# Patient Record
Sex: Female | Born: 1970 | Race: Asian | Hispanic: No | Marital: Married | Smoking: Never smoker
Health system: Southern US, Community
[De-identification: ages and names within clinical notes are randomized; demographics above are authoritative.]

---

## 2004-06-22 ENCOUNTER — Other Ambulatory Visit: Admission: RE | Admit: 2004-06-22 | Discharge: 2004-06-22 | Payer: Self-pay | Admitting: Obstetrics and Gynecology

## 2004-08-20 ENCOUNTER — Ambulatory Visit (HOSPITAL_COMMUNITY): Admission: RE | Admit: 2004-08-20 | Discharge: 2004-08-20 | Payer: Self-pay | Admitting: Obstetrics and Gynecology

## 2004-10-21 ENCOUNTER — Ambulatory Visit: Admission: RE | Admit: 2004-10-21 | Discharge: 2004-10-21 | Payer: Self-pay | Admitting: Obstetrics and Gynecology

## 2004-11-25 ENCOUNTER — Ambulatory Visit (HOSPITAL_COMMUNITY): Admission: RE | Admit: 2004-11-25 | Discharge: 2004-11-25 | Payer: Self-pay | Admitting: Obstetrics and Gynecology

## 2004-12-14 ENCOUNTER — Inpatient Hospital Stay (HOSPITAL_COMMUNITY): Admission: AD | Admit: 2004-12-14 | Discharge: 2004-12-14 | Payer: Self-pay | Admitting: Internal Medicine

## 2004-12-16 ENCOUNTER — Inpatient Hospital Stay (HOSPITAL_COMMUNITY): Admission: AD | Admit: 2004-12-16 | Discharge: 2004-12-18 | Payer: Self-pay | Admitting: Obstetrics and Gynecology

## 2005-05-21 ENCOUNTER — Emergency Department (HOSPITAL_COMMUNITY): Admission: EM | Admit: 2005-05-21 | Discharge: 2005-05-21 | Payer: Self-pay | Admitting: Family Medicine

## 2005-06-23 ENCOUNTER — Other Ambulatory Visit: Admission: RE | Admit: 2005-06-23 | Discharge: 2005-06-23 | Payer: Self-pay | Admitting: Obstetrics and Gynecology

## 2005-08-26 ENCOUNTER — Ambulatory Visit (HOSPITAL_COMMUNITY): Admission: RE | Admit: 2005-08-26 | Discharge: 2005-08-26 | Payer: Self-pay | Admitting: Family Medicine

## 2007-04-08 ENCOUNTER — Emergency Department (HOSPITAL_COMMUNITY): Admission: EM | Admit: 2007-04-08 | Discharge: 2007-04-08 | Payer: BLUE CROSS/BLUE SHIELD | Admitting: Family Medicine

## 2007-10-31 ENCOUNTER — Encounter (INDEPENDENT_AMBULATORY_CARE_PROVIDER_SITE_OTHER): Payer: Self-pay | Admitting: Obstetrics and Gynecology

## 2007-10-31 ENCOUNTER — Ambulatory Visit: Admission: RE | Admit: 2007-10-31 | Discharge: 2007-10-31 | Payer: Self-pay | Admitting: Obstetrics and Gynecology

## 2007-10-31 ENCOUNTER — Ambulatory Visit: Payer: Self-pay | Admitting: Vascular Surgery

## 2007-11-01 ENCOUNTER — Inpatient Hospital Stay (HOSPITAL_COMMUNITY): Admission: AD | Admit: 2007-11-01 | Discharge: 2007-11-03 | Payer: Self-pay | Admitting: Obstetrics and Gynecology

## 2008-02-23 ENCOUNTER — Ambulatory Visit: Payer: Self-pay | Admitting: Vascular Surgery

## 2008-02-23 ENCOUNTER — Inpatient Hospital Stay (HOSPITAL_COMMUNITY): Admission: EM | Admit: 2008-02-23 | Discharge: 2008-02-28 | Payer: Self-pay | Admitting: Emergency Medicine

## 2008-02-23 ENCOUNTER — Encounter (INDEPENDENT_AMBULATORY_CARE_PROVIDER_SITE_OTHER): Payer: Self-pay | Admitting: Internal Medicine

## 2008-02-25 ENCOUNTER — Encounter (INDEPENDENT_AMBULATORY_CARE_PROVIDER_SITE_OTHER): Payer: Self-pay | Admitting: Emergency Medicine

## 2008-02-25 ENCOUNTER — Ambulatory Visit: Payer: Self-pay | Admitting: Internal Medicine

## 2010-02-12 ENCOUNTER — Ambulatory Visit: Payer: Self-pay | Admitting: Vascular Surgery

## 2010-02-12 ENCOUNTER — Ambulatory Visit: Admission: RE | Admit: 2010-02-12 | Discharge: 2010-02-12 | Payer: Self-pay | Admitting: Internal Medicine

## 2010-02-12 ENCOUNTER — Encounter (INDEPENDENT_AMBULATORY_CARE_PROVIDER_SITE_OTHER): Payer: Self-pay | Admitting: Internal Medicine

## 2010-11-23 NOTE — Discharge Summary (Signed)
Heather Poole, Heather Poole                       ACCOUNT NO.:  [id number]   MEDICAL RECORD NO.:  [id number]          PATIENT TYPE:  INP   LOCATION:  9112                          FACILITY:  [facility]   PHYSICIAN:  Zenaida Niece, M.D.DATE OF BIRTH:  1971-03-18   DATE OF ADMISSION:  11/01/2007  DATE OF DISCHARGE:  11/03/2007                               DISCHARGE SUMMARY   ADMISSION DIAGNOSES:  1. Intrauterine pregnancy at 38 weeks.  2. Advanced maternal age.   DISCHARGE DIAGNOSES:  1. Intrauterine pregnancy at 38 weeks.  2. Advanced maternal age.   PROCEDURE:  On November 01, 2007 she had a spontaneous vaginal delivery.   HISTORY AND PHYSICAL:  This is a 40 year old Asian female gravida 2,  para 1-0-0-1 with an EGA of [redacted] weeks who presents with the complaint of  regular contractions.  Exam in the office revealed cervix to be 2 cm  dilated.  Prenatal care was late at 40 weeks and was uneventful except  for advanced maternal age for which she was too late for genetic  screening.  Prenatal labs, blood type is B+ with negative antibody  screen, rubella immune, RPR nonreactive, HIV negative, hepatitis B  surface antigen negative, gonorrhea and chlamydia negative, group B  strep negative, 1-hour Glucola 115.   PAST OBG HISTORY:  One vaginal delivery at term 6 pounds 8 ounces.   GYN HISTORY:  Low grade SIL Pap smear with normal colposcopy.   PHYSICAL EXAMINATION:  She is afebrile with stable vital signs.  ABDOMEN:  Gravid, nontender.  CERVIX:  Dr. Berenda Morale first exam in labor and delivery, she has 5 to  6, complete -1 and few rupture membranes revealed moderate meconium.   HOSPITAL COURSE:  The patient was admitted and continued progress on her  own.  Dr. Senaida Ores examined her rupture membranes and discovered  meconium.  She was placed in internal monitors.  She progressed to 9 cm  and began involuntarily pushing.  Cervix was reduced and after about 15  minutes, she had a vaginal delivery of a  viable female infant with  Apgars of 5 and 9, weight 7 pounds 11 ounces.  Tight nuchal cord was  delivered through.  DeLee suction was done on the perineum.  Placenta  delivered spontaneous, and trailing membranes removed with ring forceps.  She had a second-degree laceration repaired with 2-0 Vicryl and  estimated blood loss was 400 mL.  Postpartum, she had no significant  complications.  Pre delivery hemoglobin 11.6, postdelivery 11.5.  On  postpartum #2 she was felt to be stable enough for discharge home.   DISCHARGE INSTRUCTIONS:  Regular diet, pelvic rest, follow up in 6  weeks.   MEDICATIONS:  1. Ibuprofen 600 mg #30 1 p.o. q.6 h p.r.n.  2. Percocet #20, 1 to 2 p.o. q. 4-6 hours p.r.n. pain and she is given      our discharge pamphlet.      Zenaida Niece, M.D.  Electronically Signed     TDM/MEDQ  D:  11/03/2007  T:  11/03/2007  Job:  [id number] 

## 2010-11-23 NOTE — Discharge Summary (Signed)
[name], Poole                   ACCOUNT NO.:  [id number]   MEDICAL RECORD NO.:  [id number]          PATIENT TYPE:  INP   LOCATION:  3742                         FACILITY:  [facility]   PHYSICIAN:  Altha Harm, MDDATE OF BIRTH:  Mar 03, 1971   DATE OF ADMISSION:  02/22/2008  DATE OF DISCHARGE:  02/28/2008                               DISCHARGE SUMMARY   DISCHARGE DISPOSITION:  Home.   FINAL DISCHARGE DIAGNOSES:  1. Latent tuberculosis.  2. Hypothyroidism.  3. Diarrhea, noninfectious.  4. Leukocytosis.  5. Systemic inflammatory response syndrome, resolved.  6. Hypertension, resolved.  7. Hypokalemia, resolved.  8. Hyponatremia, resolved.  9. Lower extremity swelling and pain.   DISCHARGE MEDICATIONS:  Synthroid 50 mcg p.o. daily.   CONSULTANTS:  1. Dr. Cliffton Asters, Infectious Diseases.  2. Phone consultation with Dr. Jimmy Footman.   CODE STATUS:  Full code.   ALLERGIES:  No known drug allergies.   CHIEF COMPLAINT:  Worsening lower extremity pain and swelling.   HISTORY OF PRESENT ILLNESS:  Refer to the H&P dictated by Dr. Arthor Captain for  details of the HPI.   HOSPITAL COURSE:  1. Systemic inflammatory response syndrome.  The patient was admitted      with a constellation of symptoms including hypotension,      leukocytosis, fever, swelling, and generalized malaise.  The      patient has had cultures sent, including blood and urine cultures      which were all negative.  The patient had been started on broad-      spectrum antibiotics due to her clinical presentation.  The      antibiotics were discontinued with the input from Infectious      Diseases and the patient was evaluated off antibiotics.  The      patient was tested for tuberculosis and found to test positive.      Follow up with the health department confirmed that the patient had      been tested positive for TB in the past.  However, had not received      any direct observed treatment.  The health  department was      contacted, and one of the representatives,[name] came in      and met the patient and gave the patient followup information to      follow in the health department for direct observed treatment.  I      spoke with Ms. Church myself.  Her phone number [phone], and she      has assured me that the patient has been instructed to contact them      after discharge from the hospital.  I informed her that the patient      is being discharged today and she also has follow up and contact      information of the patient and they will also make contact with the      patient to initiate the directed observed treatment for INH.  2. Axillary adenopathy.  The patient was found to have axillary  adenopathy and had been seen by Dr. Jimmy Footman of Rheumatology for      evaluation of this in the past.  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  The      patient did not return to Dr. Jeanine Luz office after the second      visit and the workup was incomplete.  I spoke with Dr. Jimmy Footman on      the phone.  He will be out of town until Monday and has advised      that the patient upon discharge should call his office and make an      appointment to follow up with outpatient workup and management of      her condition.  3. Hypothyroidism.  The patient was found to be hypothyroid and      started on Synthroid.   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  Dietary Restrictions: None    Dictation ended at this point.      Altha Harm, MD  Electronically Signed     MAM/MEDQ  D:  02/28/2008  T:  02/29/2008  Job:  [id number]

## 2010-11-23 NOTE — H&P (Signed)
Heather Poole, Heather Poole                   ACCOUNT NO.:  [id number]   MEDICAL RECORD NO.:  [id number]          PATIENT TYPE:  INP   LOCATION:  5024                         FACILITY:  [facility]   PHYSICIAN:  Michelene Gardener, MD    DATE OF BIRTH:  1970-11-03   DATE OF ADMISSION:  02/22/2008  DATE OF DISCHARGE:                              HISTORY & PHYSICAL   PRIMARY CARE PHYSICIAN:  None. The patient was admitted as unassigned.   CHIEF COMPLAINT:  Increasing lower extremities pain and swelling.   HISTORY OF PRESENT ILLNESS:  This is a 40 year old Asian female with no  significant past medical history presented with the above mentioned  complaint.  This patient is a very poor historian and her English is  limited.  Her major complaint was increasing lower extremities pain and  lower extremities swelling.  Upon further questioning, she stated that  she had been having those symptoms for the last 2-3 years.  She had  episodes where it does get worse.  Actually yesterday, her pain and  swelling was increasing and she went to the Urgent Care for further  evaluation.  In there, they found that her blood pressure was in the low  side and she had fever.  She was sent to the hospital for further  evaluation.  In the ER, her systolic blood pressure was in the 70s to  80s.  The patient was resuscitated with IV fluids.  Current systolic  blood pressure in the range of 90s.  Her T-max in the hospital was  100.4.   PAST MEDICAL HISTORY:  Significant for none.   PAST SURGICAL HISTORY:  Denied.   ALLERGIES:  NO KNOWN DRUG ALLERGIES.   CURRENT MEDICATIONS:  None.   SOCIAL HISTORY:  She denied smoking, denied alcohol drinking and she  denied recreational drugs.   FAMILY HISTORY:  No hypertension or diabetes and no coronary artery  disease.   REVIEW OF SYSTEMS:  CONSTITUTIONAL:  Positive for fever and  fatigability.  Eyes:  No blurred vision.  ENT: No tinnitus.  RESPIRATORY:  No cough, no wheezes.   CARDIOVASCULAR:  No chest pain and  no shortness of breath.  GI:  No nausea, no vomiting, no diarrhea, but  she had some abdominal discomfort.  GU:  Positive for decreasing  urination.  There is no hematuria.  ENDOCRINE:  No polyuria, no  nocturia.  HEMATOLOGY:  No bruises, no bleeding.  SKIN:  No rash and no  lesions.  NEURO:  No numbness.  No tingling.  Rest of systems were  reviewed and they were negative.   PHYSICAL EXAMINATION:  VITAL SIGNS: T-max is 100.4, blood pressure 80/40  on the time of admission and currently is 90/48.  GENERAL APPEARANCE:  This is a middle-aged, malnourished Asian female  not in acute distress.  HEENT:  Her conjunctivae are pink.  Pupils are pupils are equal and  reactive.  There is no ptosis.  Hearing is intact.  There is no ear  discharge or infection.  There is no nose infection or bleeding.  Oral  mucosa is dry.  No pharyngeal erythema.  NECK:  Supple.  No JVD, no carotid bruit, no lymphadenopathy, no thyroid  enlargement, no thyroid tenderness.  CARDIOVASCULAR:  S1 and S2 regular.  There are no murmurs.  No gallops.  No thrills.  RESPIRATORY:  The patient is breathing between 16-18.  There are no  rales, no rhonchi, no wheezes.  ABDOMEN:  Soft, nondistended.  There is mild tenderness which is diffuse  to deep palpation.  Bowel sounds are present and there is no  hepatosplenomegaly.  LOWER EXTREMITIES:  They were swollen bilaterally with areas of  discoloration.  The patient has bilateral tenderness.  Pulses are  decreased.  NEURO:  The patient is alert, awake.  She is a very poor historian.  There are no neurological deficits.   LABORATORY DATA:  WBC 18.1, hemoglobin 12.2, hematocrit 37.4, platelet  count is 201, neutrophils 87%, sodium 33, potassium 3.0, chloride 100,  bicarb 24, glucose 104, BUN 11, creatinine 1.05, alkaline phosphatase  77, AST 33, ALT 40.  Urinalysis negative.   IMPRESSION:  1. Systemic inflammatory response syndrome.  2.  Hypotension.  3. Hypokalemia.  4. Hyponatremia.  5. Lower extremities swelling and pain.   PLAN:  1. Systemic inflammatory response syndrome.  This patient has fever      and elevated white counts.  There is no source of infection so far.      Her chest x-ray is negative.  CT scan of the abdomen and pelvis are      done and the results are pending at this time.  I will start her on      Avelox 400 mg IV once a day.  I will get urine culture in addition      to blood cultures x2.  2. Hypotension.  Reason is not clear at this time, but might be      related to her underlying infection.  The patient already      resuscitated with IV fluids and her blood pressure improved.  Her      current systolic blood pressure in the range of 90s.  I will      continue her on maintenance fluids and normal saline at 100 mL an      hour, and I will follow her blood pressure closely.  3. Hypokalemia.  We will supplement her potassium and we will get      magnesium level.  4. Hyponatremia.  I will give her IV fluids and repeat her sodium      level.  5. Lower extremities swelling and pain that has been chronic and is      unclear if she  underwent further evaluation for that.  I will get      echocardiogram to assess her ejection fraction.  I will also get      bilateral venous Doppler because of the associated calf tenderness.   Total assessment time is 1 hour.      Michelene Gardener, MD  Electronically Signed     NAE/MEDQ  D:  02/23/2008  T:  02/23/2008  Job:  [id number]

## 2010-11-23 NOTE — Discharge Summary (Signed)
[name], Heather Poole                   ACCOUNT NO.:  [id number]   MEDICAL RECORD NO.:  [id number]          Poole TYPE:  INP   LOCATION:  3742                         FACILITY:  [facility]   PHYSICIAN:  Altha Harm, MDDATE OF BIRTH:  Jan 08, 1971   DATE OF ADMISSION:  02/22/2008  DATE OF DISCHARGE:                               DISCHARGE SUMMARY   CONTINUATION   Please note that Heather dictation was interrupted due to a temporary loss  of power.   1. As I was stating Heather Poole was found to be hypothyroid here in      [facility] hospital with a TSH of 6.242.  Her free T4 was at Heather low end      of normal of 0.96.  Heather Poole was started on Synthroid 50 mcg      daily, and should have her TSH checked in 6 weeks to evaluate for      further need for titration.  2. Mild normocytic anemia.  Heather Poole needs to be evaluated on an      outpatient basis.  This may be associated with her vasculitis, and      can occur as a part of Heather evaluation through her rheumatologist.  3. Diarrhea.  Heather Poole started having diarrhea on Heather last few days      prior to admission.  Her stool is sent off with Clostridium      difficile.  However, it was delayed in collection due to Heather fact      that Heather Poole kept discarding her stool.  Heather Poole, however,      shows no signs of illness.  She does not have an elevated white      count at this time.  Her abdomen is soft and nontender, and as of      today her stools have started to become formed.  Thus  Heather Poole      will be discharged without any antibiotics as I doubt that this is      Clostridium difficile on this Poole.  Heather Poole has been      advised to use Imodium on a p.r.n. basis.   Today Heather Poole is afebrile.  Vital signs are stable.  Her temperature  is 98.3, heart rate 68, blood pressure 102/59, respiratory rate 18, O2  sats are 98% on room air.  Heather Poole is well-appearing.  She is normocephalic, atraumatic.  Mucosa is moist.  No  ecudate,  erythema or lesions are noted.  LUNGS:  Clear to auscultation.  She has a normal S1 and S2.  No murmurs, rubs or gallops.  No ectopy on monitor.  ABDOMEN:  Soft, nontender, nondistended.  No masses, no  hepatosplenomegaly.   FOLLOW UP:  Heather Poole is to follow up with [facility]  or direct service for treatment for INH.  Heather Poole is to follow up  with Dr. Jimmy Footman in his office for further workup of her adenopathy  which may indicate a  vasculitis.  Heather Poole is to follow up with her  primary care doctor for further evaluation of her thyroid and titration  of her medications.      Altha Harm, MD  Electronically Signed     MAM/MEDQ  D:  02/28/2008  T:  02/28/2008  Job:  [id number]

## 2010-11-26 NOTE — H&P (Signed)
NAMELOREEN, BANKSON                       ACCOUNT NO.:  [id number]   MEDICAL RECORD NO.:  [id number]          PATIENT TYPE:  MAT   LOCATION:  MATC                          [facility]ACILITY:  [facility]   PHYSICIAN:  Naima A. Dillard, M.D. DATE OF BIRTH:  1971-01-10   DATE OF ADMISSION:  12/16/2004  DATE OF DISCHARGE:                                HISTORY & PHYSICAL   Ms. Heather Poole is a 40 year old gravida 1, para 0, at 39-6/7 weeks, EDD December 17, 2004,  who presents with complaints of contractions throughout the day and night,  becoming stronger.  The patient speaks [place] with limited [place].  Interpreter through the language line.  She reports positive fetal movement,  no vaginal bleeding, no rupture of membranes.  Denies any headache, visual  changes or epigastric pain.  Her pregnancy has been followed by the C[facility]N.M.  service at [facility] and is remarkable for:   1.  Limited English.  2.  Decreased BMI.  3.  Question last menstrual period.  4.  Negative group B strep.   Ms. Heather Poole began prenatal care at [facility] on June 22, 2004, at approximately  nine weeks' gestation.  EDC determined by pregnancy ultrasound at 14 weeks  and confirmed with follow-up.  Her pregnancy has been essentially  unremarkable.  She was size equal to dates up to 32 weeks and then became  size les than dates.  Ultrasound at 35 weeks' gestation found baby to be in  vertex presentation, AFI 12.0 cm, within normal limits, and estimated fetal  weight between the 25th and 50th percentile.  Otherwise, the patient has  been normotensive throughout her pregnancy with no proteinuria.   PAST OBSTETRICAL HISTORY:  The current pregnancy.   PRENATAL LABORATORY DATA:  Hemoglobin and hematocrit 12.5 and 37.7,  platelets 176,000.  Blood type and Rh B positive.  Antibody screen negative.  VDRL nonreactive.  Rubella immune.  Hepatitis B surface antigen negative.  HIV declined.  Pap smear within normal limits.  GC and Chlamydia negative.  CF testing  declined.  Quad screen within normal limits.  At 28 weeks, one-  hour glucose challenge 114 and hemoglobin 11.3.  At 36 weeks, culture of the  vaginal tract was negative for group B strep, GC and Chlamydia.   PAST MEDICAL HISTORY:  Unremarkable.   FAMILY HISTORY:  Unremarkable.   GENETIC HISTORY:  There is no genetic history of familial or chromosomal  disorders, children that died in infancy or that were born with birth  defects.   SOCIAL HISTORY:  She is a 40 year old married [place] female.  Her  husband, Horald Chestnut, is supportive.  He is present with the patient.   REVIEW OF SYSTEMS:  The patient is typical of one with a uterine pregnancy  at term in early labor.  She is 2 cm dilated, spontaneous rupture of  membranes occurred on exam for thick meconium fluid.   PHYSICAL EXAMINATION:  VITAL SIGNS:  Stable, afebrile.  HEENT:  Unremarkable.  CARDIAC:  Regular rate and rhythm.  CHEST:  Lungs are clear.  ABDOMEN:  Gravid in its contour.  Uterine fundus is noted to extend 38 cm  above the level of the pubic symphysis.  Leopold's maneuvers find the infant  to be in a longitudinal lie, cephalic presentation, and the estimated fetal  weight is 6-1/2 pounds.  The baseline of the fetal heart rate monitor is  140s with average long-term variability.  Reactivity is present with no  periodic changes.  The patient is contracting every four to five minutes.  PELVIC:  Digital exam of the cervix finds it to be 2 cm dilated, 90%  effaced, with the cephalic presenting part at a -1 station.  Spontaneous  rupture of membranes on exam at 0530 for thick, meconium-stained fluid.  EXTREMITIES:  No pathologic edema.  DTRs are 1+ with no clonus.   ASSESSMENT:  Intrauterine pregnancy at term, early labor, spontaneous  rupture of membranes, thick meconium.   PLAN:  Admit per Naima A. Normand Sloop, M.D.  Routine C.N.M. orders.  Plan  amnioinfusion secondary to thick meconium.  This will be accomplished  when  the patient is settled on birthing suite.  Fetal heart rate will be  monitored carefully as labor progresses.       SDM/MEDQ  D:  12/16/2004  T:  12/16/2004  Job:  [id number]

## 2010-11-26 NOTE — H&P (Signed)
NAMEADRIAN, Heather Poole                       ACCOUNT NO.:  [id number]   MEDICAL RECORD NO.:  [id number]          PATIENT TYPE:  MAT   LOCATION:  [facility]C                          [facility]   PHYSICIAN:  Rica Koyanagi, C.N.M.DATE OF BIRTH:  Jan 13, 1971   DATE OF ADMISSION:  12/16/2004  DATE OF DISCHARGE:                                HISTORY & PHYSICAL   No dictation.      SDM/MEDQ  D:  12/16/2004  T:  12/16/2004  Job:  [id number]

## 2011-04-05 LAB — CBC
HCT: 34.8 — ABNORMAL LOW
MCHC: 33.1
MCV: 81.6
MCV: 82.7
Platelets: 195
Platelets: 210
RDW: 16.4 — ABNORMAL HIGH
WBC: 14.3 — ABNORMAL HIGH
WBC: 9.5

## 2011-04-08 LAB — COMPREHENSIVE METABOLIC PANEL
ALT: 40 — ABNORMAL HIGH
Albumin: 3.3 — ABNORMAL LOW
Alkaline Phosphatase: 77
Calcium: 8.4
Creatinine, Ser: 1.05
Total Protein: 7.7

## 2011-04-08 LAB — DIFFERENTIAL
Basophils Absolute: 0
Basophils Relative: 0
Lymphocytes Relative: 6 — ABNORMAL LOW
Lymphs Abs: 1
Monocytes Relative: 7
Neutro Abs: 15.8 — ABNORMAL HIGH
Neutrophils Relative %: 87 — ABNORMAL HIGH

## 2011-04-08 LAB — URINALYSIS, ROUTINE W REFLEX MICROSCOPIC
Glucose, UA: NEGATIVE
pH: 5.5

## 2011-04-08 LAB — CBC
HCT: 37.4
MCHC: 32.7
MCV: 81.7
RBC: 4.58
RDW: 18.1 — ABNORMAL HIGH

## 2011-04-08 LAB — POCT I-STAT, CHEM 8
Calcium, Ion: 0.98 — ABNORMAL LOW
Creatinine, Ser: 1.2
Potassium: 3.3 — ABNORMAL LOW
Sodium: 135
TCO2: 23

## 2011-04-08 LAB — PREGNANCY, URINE: Preg Test, Ur: NEGATIVE

## 2011-04-21 LAB — POCT URINALYSIS DIP (DEVICE)
Hgb urine dipstick: NEGATIVE
Nitrite: NEGATIVE
Operator id: 235561

## 2011-04-21 LAB — POCT PREGNANCY, URINE: Operator id: 235561

## 2014-05-29 ENCOUNTER — Ambulatory Visit (HOSPITAL_COMMUNITY)
Admission: RE | Admit: 2014-05-29 | Discharge: 2014-05-29 | Disposition: A | Discharge status: Home / Self Care | Payer: Managed Care, Other (non HMO) | Source: Ambulatory Visit | Attending: Internal Medicine | Admitting: Internal Medicine

## 2014-05-29 ENCOUNTER — Other Ambulatory Visit (HOSPITAL_COMMUNITY): Payer: Self-pay | Admitting: Internal Medicine

## 2014-05-29 DIAGNOSIS — R05 Cough: Secondary | ICD-10-CM | POA: Diagnosis present

## 2014-05-29 DIAGNOSIS — M479 Spondylosis, unspecified: Secondary | ICD-10-CM | POA: Insufficient documentation

## 2014-05-29 DIAGNOSIS — J209 Acute bronchitis, unspecified: Secondary | ICD-10-CM

## 2014-05-29 DIAGNOSIS — R0602 Shortness of breath: Secondary | ICD-10-CM | POA: Diagnosis present

## 2014-05-29 DIAGNOSIS — R079 Chest pain, unspecified: Secondary | ICD-10-CM | POA: Diagnosis not present

## 2014-12-16 ENCOUNTER — Other Ambulatory Visit: Payer: Self-pay

## 2014-12-16 DIAGNOSIS — Z1231 Encounter for screening mammogram for malignant neoplasm of breast: Secondary | ICD-10-CM

## 2017-02-06 ENCOUNTER — Other Ambulatory Visit: Payer: Self-pay | Admitting: Internal Medicine

## 2017-02-06 DIAGNOSIS — Z1231 Encounter for screening mammogram for malignant neoplasm of breast: Secondary | ICD-10-CM

## 2017-02-13 ENCOUNTER — Ambulatory Visit
Admission: RE | Admit: 2017-02-13 | Discharge: 2017-02-13 | Disposition: A | Discharge status: Home / Self Care | Payer: Managed Care, Other (non HMO) | Source: Ambulatory Visit | Attending: Internal Medicine | Admitting: Internal Medicine

## 2017-02-13 DIAGNOSIS — Z1231 Encounter for screening mammogram for malignant neoplasm of breast: Secondary | ICD-10-CM

## 2020-01-05 ENCOUNTER — Ambulatory Visit (HOSPITAL_COMMUNITY)
Admission: EM | Admit: 2020-01-05 | Discharge: 2020-01-05 | Disposition: A | Discharge status: Home / Self Care | Payer: 59 | Attending: Urgent Care | Admitting: Urgent Care

## 2020-01-05 ENCOUNTER — Other Ambulatory Visit: Payer: Self-pay

## 2020-01-05 ENCOUNTER — Encounter (HOSPITAL_COMMUNITY): Payer: Self-pay | Admitting: Emergency Medicine

## 2020-01-05 DIAGNOSIS — R1030 Lower abdominal pain, unspecified: Secondary | ICD-10-CM

## 2020-01-05 DIAGNOSIS — R109 Unspecified abdominal pain: Secondary | ICD-10-CM

## 2020-01-05 DIAGNOSIS — M549 Dorsalgia, unspecified: Secondary | ICD-10-CM

## 2020-01-05 LAB — POCT URINALYSIS DIP (DEVICE)
Bilirubin Urine: NEGATIVE
Glucose, UA: NEGATIVE mg/dL
Hgb urine dipstick: NEGATIVE
Ketones, ur: NEGATIVE mg/dL
Leukocytes,Ua: NEGATIVE
Nitrite: NEGATIVE
Protein, ur: NEGATIVE mg/dL
Specific Gravity, Urine: 1.015 (ref 1.005–1.030)
Urobilinogen, UA: 0.2 mg/dL (ref 0.0–1.0)
pH: 7 (ref 5.0–8.0)

## 2020-01-05 MED ORDER — KETOROLAC TROMETHAMINE 60 MG/2ML IM SOLN
INTRAMUSCULAR | Status: AC
  Filled 2020-01-05: qty 2

## 2020-01-05 MED ORDER — KETOROLAC TROMETHAMINE 60 MG/2ML IM SOLN
60.0000 mg | Freq: Once | INTRAMUSCULAR | Status: AC
  Administered 2020-01-05: 60 mg via INTRAMUSCULAR

## 2020-01-05 NOTE — Discharge Instructions (Signed)
I highly recommend that you go to the emergency room today for a CT abdomen pelvis given your severe abdominal pain despite medication.  I am recommending this because you have no sign of urinary tract infection, hematuria on a urinalysis, have no constipation and yet you still have severe abdominal pain.  I would recommend following up with the PCP but you do not have one at the moment.  Please contact [facility] to establish care with a new PCP.  Again, I emphasized going to the ER today to get a CT scan to make sure you are fully evaluated for an intra-abdominal process.

## 2020-01-05 NOTE — ED Triage Notes (Signed)
Pt sts lower back and lower abd pain x 1 week that feels similar to when had bladder infection in past

## 2020-01-05 NOTE — ED Provider Notes (Signed)
[facility]   MRN: [id number] DOB: Oct 17, 1970  Subjective:   Heather Poole is a 49 y.o. female presenting for acute onset of lower abdominal pain that penetrates to her back for the past week.  Symptoms have become worse, reports 10 out of 10 pain today.  Not relieved with NSAIDs, Advil.  Has had constipation in the past but reports no problems with this now, last bowel movement was yesterday, was soft and normal, had a complete bowel movement.  She is not currently taking any medications and has no known food or drug allergies.  Denies past medical and surgical history.  Review of Systems  Constitutional: Negative for fever and malaise/fatigue.  HENT: Negative for congestion, ear pain, sinus pain and sore throat.   Eyes: Negative for discharge and redness.  Respiratory: Negative for cough, hemoptysis, shortness of breath and wheezing.   Cardiovascular: Negative for chest pain.  Gastrointestinal: Positive for abdominal pain. Negative for blood in stool, constipation, diarrhea, nausea and vomiting.  Genitourinary: Negative for dysuria, flank pain and hematuria.  Musculoskeletal: Positive for back pain. Negative for myalgias.  Skin: Negative for rash.  Neurological: Negative for dizziness, weakness and headaches.  Psychiatric/Behavioral: Negative for depression and substance abuse.     Objective:   Vitals: BP (!) 117/42 (BP Location: Right Arm)   Pulse 73   Temp 98.2 F (36.8 C) (Oral)   Resp 18   LMP 02/02/2017   SpO2 99%   Physical Exam Constitutional:      General: She is not in acute distress.    Appearance: Normal appearance. She is well-developed and normal weight. She is not ill-appearing, toxic-appearing or diaphoretic.  HENT:     Head: Normocephalic and atraumatic.     Right Ear: External ear normal.     Left Ear: External ear normal.     Nose: Nose normal.     Mouth/Throat:     Mouth: Mucous membranes are moist.     Pharynx: Oropharynx is clear.  Eyes:      General: No scleral icterus.    Extraocular Movements: Extraocular movements intact.     Pupils: Pupils are equal, round, and reactive to light.  Cardiovascular:     Rate and Rhythm: Normal rate and regular rhythm.     Pulses: Normal pulses.     Heart sounds: Normal heart sounds. No murmur heard.  No friction rub. No gallop.   Pulmonary:     Effort: Pulmonary effort is normal. No respiratory distress.     Breath sounds: Normal breath sounds. No stridor. No wheezing, rhonchi or rales.  Abdominal:     General: Bowel sounds are normal. There is no distension.     Palpations: Abdomen is soft. There is no mass.     Tenderness: There is abdominal tenderness in the right lower quadrant, periumbilical area, suprapubic area and left lower quadrant. There is guarding. There is no right CVA tenderness, left CVA tenderness or rebound.  Skin:    General: Skin is warm and dry.     Coloration: Skin is not pale.     Findings: No rash.  Neurological:     General: No focal deficit present.     Mental Status: She is alert and oriented to person, place, and time.  Psychiatric:        Mood and Affect: Mood normal.        Behavior: Behavior normal.        Thought Content: Thought content normal.  Judgment: Judgment normal.     Results for orders placed or performed during the hospital encounter of 01/05/20 (from the past 24 hour(s))  POCT urinalysis dip (device)     Status: None   Collection Time: 01/05/20  2:06 PM  Result Value Ref Range   Glucose, UA NEGATIVE NEGATIVE mg/dL   Bilirubin Urine NEGATIVE NEGATIVE   Ketones, ur NEGATIVE NEGATIVE mg/dL   Specific Gravity, Urine 1.015 1.005 - 1.030   Hgb urine dipstick NEGATIVE NEGATIVE   pH 7.0 5.0 - 8.0   Protein, ur NEGATIVE NEGATIVE mg/dL   Urobilinogen, UA 0.2 0.0 - 1.0 mg/dL   Nitrite NEGATIVE NEGATIVE   Leukocytes,Ua NEGATIVE NEGATIVE    Assessment and Plan :   PDMP not reviewed this encounter.  1. Continuous severe abdominal  pain   2. Lower abdominal pain     Patient has worsening severe abdominal pain without sign of UTI.  She also does not have constipation, therefore a KUB would not be helpful.  Recommended ER evaluation for CT abdomen pelvis given her severe belly pain that has not responded to NSAIDs at home.  Discussed this at length with patient, she is agreeable to going to the ER today.   Jaynee Eagles, [place] 01/05/20 1433

## 2020-01-07 ENCOUNTER — Telehealth (HOSPITAL_COMMUNITY): Payer: Self-pay | Admitting: Orthopedic Surgery

## 2020-01-07 LAB — URINE CULTURE: Culture: >=100000 — AB

## 2020-01-07 MED ORDER — NITROFURANTOIN MONOHYD MACRO 100 MG PO CAPS: 100.0000 mg | ORAL_CAPSULE | Freq: Two times a day (BID) | ORAL | 0 refills | Status: DC

## 2020-04-16 ENCOUNTER — Other Ambulatory Visit: Payer: Self-pay | Admitting: Internal Medicine

## 2020-04-16 DIAGNOSIS — Z1231 Encounter for screening mammogram for malignant neoplasm of breast: Secondary | ICD-10-CM

## 2020-05-05 ENCOUNTER — Other Ambulatory Visit: Payer: Self-pay

## 2020-05-05 ENCOUNTER — Ambulatory Visit
Admission: RE | Admit: 2020-05-05 | Discharge: 2020-05-05 | Disposition: A | Discharge status: Home / Self Care | Payer: 59 | Source: Ambulatory Visit | Attending: Internal Medicine | Admitting: Internal Medicine

## 2020-05-05 DIAGNOSIS — Z1231 Encounter for screening mammogram for malignant neoplasm of breast: Secondary | ICD-10-CM

## 2021-04-29 ENCOUNTER — Other Ambulatory Visit: Payer: Self-pay | Admitting: Internal Medicine

## 2021-04-29 DIAGNOSIS — Z1231 Encounter for screening mammogram for malignant neoplasm of breast: Secondary | ICD-10-CM

## 2021-06-28 ENCOUNTER — Inpatient Hospital Stay (HOSPITAL_COMMUNITY)
Admission: EM | Admit: 2021-06-28 | Discharge: 2021-07-05 | DRG: 871 | Disposition: A | Discharge status: Home / Self Care | Payer: 59 | Attending: Internal Medicine | Admitting: Internal Medicine

## 2021-06-28 DIAGNOSIS — A419 Sepsis, unspecified organism: Secondary | ICD-10-CM | POA: Diagnosis not present

## 2021-06-28 DIAGNOSIS — R509 Fever, unspecified: Secondary | ICD-10-CM | POA: Diagnosis not present

## 2021-06-28 DIAGNOSIS — Z79899 Other long term (current) drug therapy: Secondary | ICD-10-CM

## 2021-06-28 DIAGNOSIS — R0602 Shortness of breath: Secondary | ICD-10-CM

## 2021-06-28 DIAGNOSIS — E039 Hypothyroidism, unspecified: Secondary | ICD-10-CM | POA: Diagnosis present

## 2021-06-28 DIAGNOSIS — Z79890 Hormone replacement therapy (postmenopausal): Secondary | ICD-10-CM

## 2021-06-28 DIAGNOSIS — R6521 Severe sepsis with septic shock: Secondary | ICD-10-CM | POA: Diagnosis present

## 2021-06-28 DIAGNOSIS — D696 Thrombocytopenia, unspecified: Secondary | ICD-10-CM | POA: Diagnosis present

## 2021-06-28 DIAGNOSIS — E1165 Type 2 diabetes mellitus with hyperglycemia: Secondary | ICD-10-CM | POA: Diagnosis present

## 2021-06-28 DIAGNOSIS — U071 COVID-19: Secondary | ICD-10-CM | POA: Diagnosis present

## 2021-06-28 DIAGNOSIS — N390 Urinary tract infection, site not specified: Secondary | ICD-10-CM | POA: Diagnosis present

## 2021-06-28 DIAGNOSIS — Z87440 Personal history of urinary (tract) infections: Secondary | ICD-10-CM

## 2021-06-28 DIAGNOSIS — N3000 Acute cystitis without hematuria: Secondary | ICD-10-CM | POA: Diagnosis present

## 2021-06-28 DIAGNOSIS — E876 Hypokalemia: Secondary | ICD-10-CM | POA: Diagnosis present

## 2021-06-28 DIAGNOSIS — D6959 Other secondary thrombocytopenia: Secondary | ICD-10-CM | POA: Diagnosis present

## 2021-06-28 DIAGNOSIS — E871 Hypo-osmolality and hyponatremia: Secondary | ICD-10-CM | POA: Diagnosis present

## 2021-06-28 MED ORDER — ACETAMINOPHEN 500 MG PO TABS
1000.0000 mg | ORAL_TABLET | Freq: Once | ORAL | Status: AC
  Administered 2021-06-29: 01:00:00 1000 mg via ORAL
  Filled 2021-06-28: qty 2

## 2021-06-28 NOTE — ED Provider Notes (Signed)
Emergency Medicine Provider Triage Evaluation Note  Heather Poole , a 50 y.o. female  was evaluated in triage.  Pt complains of chills and not feeling well all day today.  She denies any pain.  No cough or recent known infection.  No sick contacts.    Review of Systems  Positive: Chills, fatigue Negative: Abdominal pain, vomiting  Physical Exam  BP (!) 86/51 (BP Location: Left Arm)    Pulse (!) 105    Temp (!) 102.6 F (39.2 C) (Oral)    Resp 18    LMP 02/02/2017    SpO2 97%  Gen:   Awake, no distress   Resp:  Normal effort  MSK:   Moves extremities without difficulty  Other:  Warm to the touch, appears tired  Medical Decision Making  Medically screening exam initiated at 11:37 PM.  Appropriate orders placed.  Heather Poole was informed that the remainder of the evaluation will be completed by another provider, this initial triage assessment does not replace that evaluation, and the importance of remaining in the ED until their evaluation is complete.  Hypotensive, febrile, tachy in triage.  Sepsis orders initiated, charge RN notified-- moving to next available room.   Garlon Hatchet, PA-C 06/29/21 8502    Shon Baton, MD 06/29/21 [phone]

## 2021-06-28 NOTE — ED Triage Notes (Signed)
Pt reported to ED with c/o chills and fever for past few days. Has no known source of infection, meets SIRS criteria

## 2021-06-29 ENCOUNTER — Emergency Department (HOSPITAL_COMMUNITY): Payer: 59

## 2021-06-29 ENCOUNTER — Inpatient Hospital Stay (HOSPITAL_COMMUNITY): Payer: 59

## 2021-06-29 ENCOUNTER — Encounter (HOSPITAL_COMMUNITY): Payer: Self-pay | Admitting: Internal Medicine

## 2021-06-29 DIAGNOSIS — A419 Sepsis, unspecified organism: Principal | ICD-10-CM

## 2021-06-29 DIAGNOSIS — D696 Thrombocytopenia, unspecified: Secondary | ICD-10-CM | POA: Diagnosis present

## 2021-06-29 DIAGNOSIS — Z87440 Personal history of urinary (tract) infections: Secondary | ICD-10-CM | POA: Diagnosis not present

## 2021-06-29 DIAGNOSIS — R012 Other cardiac sounds: Secondary | ICD-10-CM | POA: Diagnosis not present

## 2021-06-29 DIAGNOSIS — E876 Hypokalemia: Secondary | ICD-10-CM

## 2021-06-29 DIAGNOSIS — E039 Hypothyroidism, unspecified: Secondary | ICD-10-CM | POA: Diagnosis present

## 2021-06-29 DIAGNOSIS — U071 COVID-19: Secondary | ICD-10-CM | POA: Diagnosis not present

## 2021-06-29 DIAGNOSIS — D6959 Other secondary thrombocytopenia: Secondary | ICD-10-CM | POA: Diagnosis present

## 2021-06-29 DIAGNOSIS — N390 Urinary tract infection, site not specified: Secondary | ICD-10-CM

## 2021-06-29 DIAGNOSIS — Z79899 Other long term (current) drug therapy: Secondary | ICD-10-CM | POA: Diagnosis not present

## 2021-06-29 DIAGNOSIS — E1165 Type 2 diabetes mellitus with hyperglycemia: Secondary | ICD-10-CM | POA: Diagnosis not present

## 2021-06-29 DIAGNOSIS — R6521 Severe sepsis with septic shock: Secondary | ICD-10-CM | POA: Diagnosis present

## 2021-06-29 DIAGNOSIS — E871 Hypo-osmolality and hyponatremia: Secondary | ICD-10-CM | POA: Diagnosis present

## 2021-06-29 DIAGNOSIS — R509 Fever, unspecified: Secondary | ICD-10-CM | POA: Diagnosis present

## 2021-06-29 DIAGNOSIS — Z79890 Hormone replacement therapy (postmenopausal): Secondary | ICD-10-CM | POA: Diagnosis not present

## 2021-06-29 DIAGNOSIS — N3000 Acute cystitis without hematuria: Secondary | ICD-10-CM | POA: Diagnosis present

## 2021-06-29 LAB — CBC WITH DIFFERENTIAL/PLATELET
Abs Immature Granulocytes: 0.02 10*3/uL (ref 0.00–0.07)
Abs Immature Granulocytes: 0.07 10*3/uL (ref 0.00–0.07)
Basophils Absolute: 0 10*3/uL (ref 0.0–0.1)
Basophils Absolute: 0 10*3/uL (ref 0.0–0.1)
Basophils Relative: 0 %
Basophils Relative: 0 %
Eosinophils Absolute: 0 10*3/uL (ref 0.0–0.5)
Eosinophils Absolute: 0 10*3/uL (ref 0.0–0.5)
Eosinophils Relative: 0 %
Eosinophils Relative: 0 %
HCT: 31.3 % — ABNORMAL LOW (ref 36.0–46.0)
HCT: 33.6 % — ABNORMAL LOW (ref 36.0–46.0)
Hemoglobin: 10.2 g/dL — ABNORMAL LOW (ref 12.0–15.0)
Hemoglobin: 11.3 g/dL — ABNORMAL LOW (ref 12.0–15.0)
Immature Granulocytes: 0 %
Immature Granulocytes: 1 %
Lymphocytes Relative: 8 %
Lymphocytes Relative: 9 %
Lymphs Abs: 0.5 10*3/uL — ABNORMAL LOW (ref 0.7–4.0)
Lymphs Abs: 0.6 10*3/uL — ABNORMAL LOW (ref 0.7–4.0)
MCH: 27.7 pg (ref 26.0–34.0)
MCH: 28 pg (ref 26.0–34.0)
MCHC: 32.6 g/dL (ref 30.0–36.0)
MCHC: 33.6 g/dL (ref 30.0–36.0)
MCV: 83.2 fL (ref 80.0–100.0)
MCV: 85.1 fL (ref 80.0–100.0)
Monocytes Absolute: 0.5 10*3/uL (ref 0.1–1.0)
Monocytes Absolute: 0.5 10*3/uL (ref 0.1–1.0)
Monocytes Relative: 10 %
Monocytes Relative: 6 %
Neutro Abs: 4.2 10*3/uL (ref 1.7–7.7)
Neutro Abs: 6.4 10*3/uL (ref 1.7–7.7)
Neutrophils Relative %: 81 %
Neutrophils Relative %: 85 %
Platelets: 120 10*3/uL — ABNORMAL LOW (ref 150–400)
Platelets: 91 10*3/uL — ABNORMAL LOW (ref 150–400)
RBC: 3.68 MIL/uL — ABNORMAL LOW (ref 3.87–5.11)
RBC: 4.04 MIL/uL (ref 3.87–5.11)
RDW: 12.4 % (ref 11.5–15.5)
RDW: 12.7 % (ref 11.5–15.5)
WBC: 5.2 10*3/uL (ref 4.0–10.5)
WBC: 7.5 10*3/uL (ref 4.0–10.5)
nRBC: 0 % (ref 0.0–0.2)
nRBC: 0 % (ref 0.0–0.2)

## 2021-06-29 LAB — CORTISOL: Cortisol, Plasma: 17.9 ug/dL

## 2021-06-29 LAB — COMPREHENSIVE METABOLIC PANEL
ALT: 20 U/L (ref 0–44)
ALT: 41 U/L (ref 0–44)
AST: 35 U/L (ref 15–41)
AST: 71 U/L — ABNORMAL HIGH (ref 15–41)
Albumin: 3.2 g/dL — ABNORMAL LOW (ref 3.5–5.0)
Albumin: 2.5 g/dL — ABNORMAL LOW (ref 3.5–5.0)
Alkaline Phosphatase: 72 U/L (ref 38–126)
Alkaline Phosphatase: 73 U/L (ref 38–126)
Anion gap: 6 (ref 5–15)
Anion gap: 7 (ref 5–15)
BUN: 13 mg/dL (ref 6–20)
BUN: 11 mg/dL (ref 6–20)
CO2: 25 mmol/L (ref 22–32)
CO2: 23 mmol/L (ref 22–32)
Calcium: 8 mg/dL — ABNORMAL LOW (ref 8.9–10.3)
Calcium: 7.6 mg/dL — ABNORMAL LOW (ref 8.9–10.3)
Chloride: 103 mmol/L (ref 98–111)
Chloride: 104 mmol/L (ref 98–111)
Creatinine, Ser: 1.06 mg/dL — ABNORMAL HIGH (ref 0.44–1.00)
Creatinine, Ser: 0.93 mg/dL (ref 0.44–1.00)
GFR, Estimated: >60 mL/min (ref >60)
GFR, Estimated: >60 mL/min (ref >60)
Glucose, Bld: 215 mg/dL — ABNORMAL HIGH (ref 70–99)
Glucose, Bld: 229 mg/dL — ABNORMAL HIGH (ref 70–99)
Potassium: 2.8 mmol/L — ABNORMAL LOW (ref 3.5–5.1)
Potassium: 3.1 mmol/L — ABNORMAL LOW (ref 3.5–5.1)
Sodium: 134 mmol/L — ABNORMAL LOW (ref 135–145)
Sodium: 134 mmol/L — ABNORMAL LOW (ref 135–145)
Total Bilirubin: 0.7 mg/dL (ref 0.3–1.2)
Total Bilirubin: 0.8 mg/dL (ref 0.3–1.2)
Total Protein: 6.5 g/dL (ref 6.5–8.1)
Total Protein: 4.8 g/dL — ABNORMAL LOW (ref 6.5–8.1)

## 2021-06-29 LAB — URINALYSIS, MICROSCOPIC (REFLEX): WBC, UA: >50 WBC/hpf (ref 0–5)

## 2021-06-29 LAB — URINALYSIS, ROUTINE W REFLEX MICROSCOPIC
Bilirubin Urine: NEGATIVE
Glucose, UA: NEGATIVE mg/dL
Ketones, ur: NEGATIVE mg/dL
Nitrite: NEGATIVE
Protein, ur: NEGATIVE mg/dL
Specific Gravity, Urine: 1.01 (ref 1.005–1.030)
pH: 7 (ref 5.0–8.0)

## 2021-06-29 LAB — I-STAT BETA HCG BLOOD, ED (MC, WL, AP ONLY): I-stat hCG, quantitative: <5 m[IU]/mL (ref <5)

## 2021-06-29 LAB — ECHOCARDIOGRAM COMPLETE
Area-P 1/2: 2.87 cm2
Calc EF: 71.1 %
Height: 60 in
S' Lateral: 2.1 cm
Single Plane A2C EF: 73.2 %
Single Plane A4C EF: 67.8 %
Weight: 2094.4 oz

## 2021-06-29 LAB — LACTIC ACID, PLASMA
Lactic Acid, Venous: 0.9 mmol/L (ref 0.5–1.9)
Lactic Acid, Venous: 1.3 mmol/L (ref 0.5–1.9)
Lactic Acid, Venous: 1.6 mmol/L (ref 0.5–1.9)
Lactic Acid, Venous: 2.7 mmol/L (ref 0.5–1.9)

## 2021-06-29 LAB — PROTIME-INR
INR: 1.1 (ref 0.8–1.2)
Prothrombin Time: 14.5 seconds (ref 11.4–15.2)

## 2021-06-29 LAB — RESP PANEL BY RT-PCR (FLU A&B, COVID) ARPGX2
Influenza A by PCR: NEGATIVE
Influenza B by PCR: NEGATIVE
SARS Coronavirus 2 by RT PCR: POSITIVE — AB

## 2021-06-29 LAB — HEMOGLOBIN A1C
Hgb A1c MFr Bld: 6.2 % — ABNORMAL HIGH (ref 4.8–5.6)
Mean Plasma Glucose: 131.24 mg/dL

## 2021-06-29 LAB — TSH: TSH: 0.545 u[IU]/mL (ref 0.350–4.500)

## 2021-06-29 LAB — MAGNESIUM: Magnesium: 1.4 mg/dL — ABNORMAL LOW (ref 1.7–2.4)

## 2021-06-29 LAB — CBG MONITORING, ED
Glucose-Capillary: 143 mg/dL — ABNORMAL HIGH (ref 70–99)
Glucose-Capillary: 128 mg/dL — ABNORMAL HIGH (ref 70–99)
Glucose-Capillary: 116 mg/dL — ABNORMAL HIGH (ref 70–99)

## 2021-06-29 LAB — PROCALCITONIN: Procalcitonin: 28.23 ng/mL

## 2021-06-29 LAB — C-REACTIVE PROTEIN: CRP: 5.2 mg/dL — ABNORMAL HIGH (ref <1.0)

## 2021-06-29 LAB — APTT: aPTT: 28 seconds (ref 24–36)

## 2021-06-29 LAB — HIV ANTIBODY (ROUTINE TESTING W REFLEX): HIV Screen 4th Generation wRfx: NONREACTIVE

## 2021-06-29 LAB — GLUCOSE, CAPILLARY: Glucose-Capillary: 132 mg/dL — ABNORMAL HIGH (ref 70–99)

## 2021-06-29 MED ORDER — SODIUM CHLORIDE 0.9 % IV SOLN
1.0000 g | Freq: Three times a day (TID) | INTRAVENOUS | Status: AC
  Administered 2021-06-29 – 2021-07-04 (×15): 1 g via INTRAVENOUS
  Filled 2021-06-29 (×16): qty 1

## 2021-06-29 MED ORDER — ASCORBIC ACID 500 MG PO TABS
500.0000 mg | ORAL_TABLET | Freq: Every day | ORAL | Status: DC
  Administered 2021-06-29 – 2021-07-05 (×7): 500 mg via ORAL
  Filled 2021-06-29 (×7): qty 1

## 2021-06-29 MED ORDER — ONDANSETRON HCL 4 MG PO TABS
4.0000 mg | ORAL_TABLET | Freq: Four times a day (QID) | ORAL | Status: DC | PRN
  Filled 2021-06-29: qty 1

## 2021-06-29 MED ORDER — LACTATED RINGERS IV SOLN: INTRAVENOUS | Status: DC

## 2021-06-29 MED ORDER — INSULIN ASPART 100 UNIT/ML IJ SOLN
0.0000 [IU] | Freq: Three times a day (TID) | INTRAMUSCULAR | Status: DC
  Administered 2021-06-29 (×2): 2 [IU] via SUBCUTANEOUS
  Administered 2021-06-30 (×3): 3 [IU] via SUBCUTANEOUS
  Administered 2021-07-01 – 2021-07-02 (×2): 2 [IU] via SUBCUTANEOUS
  Administered 2021-07-03 – 2021-07-04 (×2): 3 [IU] via SUBCUTANEOUS
  Administered 2021-07-04: 13:00:00 2 [IU] via SUBCUTANEOUS
  Administered 2021-07-04: 08:00:00 3 [IU] via SUBCUTANEOUS

## 2021-06-29 MED ORDER — NIRMATRELVIR/RITONAVIR (PAXLOVID)TABLET
3.0000 | ORAL_TABLET | Freq: Two times a day (BID) | ORAL | Status: AC
  Administered 2021-06-29 – 2021-07-01 (×6): 3 via ORAL
  Filled 2021-06-29 (×2): qty 30

## 2021-06-29 MED ORDER — IOHEXOL 300 MG/ML  SOLN
80.0000 mL | Freq: Once | INTRAMUSCULAR | Status: AC | PRN
  Administered 2021-06-29: 04:00:00 80 mL via INTRAVENOUS

## 2021-06-29 MED ORDER — MEROPENEM 1 G IV SOLR
1.0000 g | Freq: Two times a day (BID) | INTRAVENOUS | Status: DC
  Administered 2021-06-29: 10:00:00 1 g via INTRAVENOUS
  Filled 2021-06-29 (×2): qty 1

## 2021-06-29 MED ORDER — VANCOMYCIN HCL IN DEXTROSE 1-5 GM/200ML-% IV SOLN
1000.0000 mg | Freq: Once | INTRAVENOUS | Status: DC
  Filled 2021-06-29: qty 200

## 2021-06-29 MED ORDER — ENOXAPARIN SODIUM 40 MG/0.4ML IJ SOSY
40.0000 mg | PREFILLED_SYRINGE | Freq: Every day | INTRAMUSCULAR | Status: DC
  Administered 2021-06-29 – 2021-07-05 (×7): 40 mg via SUBCUTANEOUS
  Filled 2021-06-29 (×7): qty 0.4

## 2021-06-29 MED ORDER — SODIUM CHLORIDE 0.9 % IV SOLN
2.0000 g | Freq: Once | INTRAVENOUS | Status: AC
  Administered 2021-06-29: 01:00:00 2 g via INTRAVENOUS
  Filled 2021-06-29: qty 2

## 2021-06-29 MED ORDER — VANCOMYCIN HCL 750 MG/150ML IV SOLN: 750.0000 mg | INTRAVENOUS | Status: DC

## 2021-06-29 MED ORDER — MAGNESIUM SULFATE 2 GM/50ML IV SOLN
2.0000 g | Freq: Once | INTRAVENOUS | Status: AC
  Administered 2021-06-29: 08:00:00 2 g via INTRAVENOUS
  Filled 2021-06-29: qty 50

## 2021-06-29 MED ORDER — SODIUM CHLORIDE 0.9 % IV BOLUS
500.0000 mL | Freq: Once | INTRAVENOUS | Status: AC
  Administered 2021-06-29: 08:00:00 500 mL via INTRAVENOUS

## 2021-06-29 MED ORDER — METRONIDAZOLE 500 MG/100ML IV SOLN
500.0000 mg | Freq: Two times a day (BID) | INTRAVENOUS | Status: DC
  Administered 2021-06-29: 05:00:00 500 mg via INTRAVENOUS

## 2021-06-29 MED ORDER — SODIUM CHLORIDE 0.9 % IV SOLN
250.0000 mL | INTRAVENOUS | Status: DC
  Administered 2021-06-30 – 2021-07-04 (×5): 250 mL via INTRAVENOUS

## 2021-06-29 MED ORDER — ACETAMINOPHEN 325 MG PO TABS
650.0000 mg | ORAL_TABLET | Freq: Four times a day (QID) | ORAL | Status: DC | PRN
  Administered 2021-06-29 – 2021-07-01 (×4): 650 mg via ORAL
  Filled 2021-06-29 (×4): qty 2

## 2021-06-29 MED ORDER — SODIUM CHLORIDE 0.9 % IV SOLN: 2.0000 g | Freq: Two times a day (BID) | INTRAVENOUS | Status: DC

## 2021-06-29 MED ORDER — NOREPINEPHRINE 4 MG/250ML-% IV SOLN
2.0000 ug/min | INTRAVENOUS | Status: DC
  Administered 2021-06-29: 2 ug/min via INTRAVENOUS
  Administered 2021-07-01: 04:00:00 1.5 ug/min via INTRAVENOUS
  Filled 2021-06-29 (×2): qty 250

## 2021-06-29 MED ORDER — LACTATED RINGERS IV BOLUS
1000.0000 mL | Freq: Once | INTRAVENOUS | Status: AC
  Administered 2021-06-29: 05:00:00 1000 mL via INTRAVENOUS

## 2021-06-29 MED ORDER — POLYETHYLENE GLYCOL 3350 17 G PO PACK
17.0000 g | PACK | Freq: Every day | ORAL | Status: DC | PRN
  Administered 2021-06-30: 15:00:00 17 g via ORAL
  Filled 2021-06-29: qty 1

## 2021-06-29 MED ORDER — LACTATED RINGERS IV BOLUS
1000.0000 mL | Freq: Once | INTRAVENOUS | Status: AC
  Administered 2021-06-29: 13:00:00 1000 mL via INTRAVENOUS

## 2021-06-29 MED ORDER — PANTOPRAZOLE SODIUM 40 MG IV SOLR
40.0000 mg | Freq: Two times a day (BID) | INTRAVENOUS | Status: DC
  Administered 2021-06-30 (×2): 40 mg via INTRAVENOUS
  Filled 2021-06-29 (×2): qty 40

## 2021-06-29 MED ORDER — ACETAMINOPHEN 650 MG RE SUPP: 650.0000 mg | Freq: Four times a day (QID) | RECTAL | Status: DC | PRN

## 2021-06-29 MED ORDER — GUAIFENESIN-DM 100-10 MG/5ML PO SYRP: 10.0000 mL | ORAL_SOLUTION | ORAL | Status: DC | PRN

## 2021-06-29 MED ORDER — LACTATED RINGERS IV SOLN
INTRAVENOUS | Status: DC
Start: 2021-06-29 — End: 2021-07-01

## 2021-06-29 MED ORDER — LACTATED RINGERS IV BOLUS: 1000.0000 mL | Freq: Once | INTRAVENOUS | Status: DC

## 2021-06-29 MED ORDER — ONDANSETRON HCL 4 MG/2ML IJ SOLN: 4.0000 mg | Freq: Four times a day (QID) | INTRAMUSCULAR | Status: DC | PRN

## 2021-06-29 MED ORDER — METRONIDAZOLE 500 MG/100ML IV SOLN
500.0000 mg | Freq: Once | INTRAVENOUS | Status: DC
  Filled 2021-06-29: qty 100

## 2021-06-29 MED ORDER — POTASSIUM CHLORIDE CRYS ER 20 MEQ PO TBCR
40.0000 meq | EXTENDED_RELEASE_TABLET | ORAL | Status: AC
  Administered 2021-06-29 (×2): 40 meq via ORAL
  Filled 2021-06-29 (×2): qty 2

## 2021-06-29 MED ORDER — ALBUTEROL SULFATE HFA 108 (90 BASE) MCG/ACT IN AERS
2.0000 | INHALATION_SPRAY | Freq: Four times a day (QID) | RESPIRATORY_TRACT | Status: DC
  Administered 2021-06-29 (×2): 2 via RESPIRATORY_TRACT
  Filled 2021-06-29 (×2): qty 6.7

## 2021-06-29 MED ORDER — ZINC SULFATE 220 (50 ZN) MG PO CAPS
220.0000 mg | ORAL_CAPSULE | Freq: Every day | ORAL | Status: DC
  Administered 2021-06-29 – 2021-07-05 (×7): 220 mg via ORAL
  Filled 2021-06-29 (×7): qty 1

## 2021-06-29 MED ORDER — LACTATED RINGERS IV BOLUS
1000.0000 mL | Freq: Once | INTRAVENOUS | Status: AC
  Administered 2021-06-29: 01:00:00 1000 mL via INTRAVENOUS

## 2021-06-29 MED ORDER — LACTATED RINGERS IV BOLUS
1000.0000 mL | Freq: Once | INTRAVENOUS | Status: AC
  Administered 2021-06-29: 03:00:00 1000 mL via INTRAVENOUS

## 2021-06-29 NOTE — ED Notes (Addendum)
MD notified of pt current bp of 87/50. Pt remains alert and oriented x 4. Cardiac monitoring in place. CCM paged at this time.

## 2021-06-29 NOTE — Progress Notes (Addendum)
Pharmacy Antibiotic Note  Heather Poole is a 50 y.o. female admitted on 06/28/2021 with sepsis.  Pharmacy originally consulted for Vancomycin/Cefepime dosing - now changing antibiotics to Meropenem with h/o ESBL infection . WBC WNL. Renal function ok. Tmax 102.6. Pt also on Flagyl.  Plan: Meropenem 1gm IV q12h Trend WBC, temp, renal function  F/U infectious work-up   Height: 5' (152.4 cm) Weight: 59.4 kg (130 lb 14.4 oz) IBW/kg (Calculated) : 45.5  Temp (24hrs), Avg:100.5 F (38.1 C), Min:98.3 F (36.8 C), Max:102.6 F (39.2 C)  Recent Labs  Lab 06/29/21 0003 06/29/21 0502  WBC 7.5  --   CREATININE 1.06* 0.93  LATICACIDVEN 1.3  --      Estimated Creatinine Clearance: 58.4 mL/min (by C-G formula based on SCr of 0.93 mg/dL).    No Known Allergies  Christoper Fabian, PharmD, BCPS Please see amion for complete clinical pharmacist phone list 06/29/2021 8:58 AM

## 2021-06-29 NOTE — ED Notes (Signed)
Pt used the bedpan for with no success. Insisted to go to bedside commode. Explained to pt that her bp is low and she could pass out. Pt states she is fully aware and still wants to go to bedside commode. Cardiac monitoring continued. Pt is alert and oriented x 4. Assisted to bedside commode by this RN. Will continue to monitor.

## 2021-06-29 NOTE — Assessment & Plan Note (Signed)
·   Replacing with potassium chloride °· Evaluating for concurrent hypomagnesemia  °· Monitoring potassium levels with serial chemistries. ° °

## 2021-06-29 NOTE — Progress Notes (Signed)
Patients BP 77/41, 77/45, 76/42 (53) Sinus Rhythm 90-100 RA 96%.   MD paged regarding decreasing BP, MD put in order for Levophed, Rapid called to bedside to start Levo. MD consulted CCM pt to transfer to ICU.

## 2021-06-29 NOTE — Progress Notes (Signed)
Called to assist with starting Levo and transporting to ICU. PT lethargic but arousable BP 79/40 MAP 52. Will transfer to 68m04.

## 2021-06-29 NOTE — ED Notes (Signed)
Pt wants to go the bathroom. Advised by RN not to stand up as pt bp is low. Pt insists that she goes to the bedside commode. Risks of syncope explained. Pt stated, "If I passed out, it's on me. I still want to stand up and go instead of bedpan and purewick." Will continue to monitor.

## 2021-06-29 NOTE — Progress Notes (Signed)
Pharmacy Antibiotic Note  Heather Poole is a 50 y.o. female admitted on 06/28/2021 with sepsis.  Pharmacy has been consulted for Vancomycin/Cefepime dosing. WBC WNL. Renal function ok. Tmax 102.6.   Plan: Vancomycin 750 mg IV q24h >>>Estimated AUC: 418 Cefepime 2g IV q12h Trend WBC, temp, renal function  F/U infectious work-up Drug levels as indicated   Height: 5' (152.4 cm) Weight: 59.4 kg (130 lb 14.4 oz) IBW/kg (Calculated) : 45.5  Temp (24hrs), Avg:101.6 F (38.7 C), Min:100.5 F (38.1 C), Max:102.6 F (39.2 C)  Recent Labs  Lab 06/29/21 0003  WBC 7.5  CREATININE 1.06*  LATICACIDVEN 1.3    Estimated Creatinine Clearance: 51.2 mL/min (A) (by C-G formula based on SCr of 1.06 mg/dL (H)).    No Known Allergies  Abran Duke, PharmD, BCPS Clinical Pharmacist Phone: [phone]

## 2021-06-29 NOTE — ED Provider Notes (Signed)
[facility] Provider Note   CSN: [id number] Arrival date & time: 06/28/21  2252     History Chief Complaint  Patient presents with   Weakness    Heather Poole is a 50 y.o. female.  Patient presents to the emergency department with a chief complaint of fever and generalized body aches.  She states the symptoms started yesterday.  She reports associated suprapubic pain, and reports history of bladder infections.  She states this feels somewhat similar.  She denies any cough, nausea, vomiting, or diarrhea.  Denies any known sick contacts.  Denies any treatments prior to arrival.  The history is provided by the patient and a relative. The history is limited by a language barrier. No language interpreter was used ([place] not available, son helps translate).      No past medical history on file.  There are no problems to display for this patient.   No past surgical history on file.   OB History   No obstetric history on file.     No family history on file.     Home Medications Prior to Admission medications   Medication Sig Start Date End Date Taking? Authorizing Provider  nitrofurantoin, macrocrystal-monohydrate, (MACROBID) 100 MG capsule Take 1 capsule (100 mg total) by mouth 2 (two) times daily. 01/07/20   Lamptey, Myrene Galas, MD    Allergies    Patient has no known allergies.  Review of Systems   Review of Systems  All other systems reviewed and are negative.  Physical Exam Updated Vital Signs BP (!) 86/51 (BP Location: Left Arm)    Pulse (!) 105    Temp (!) 102.6 F (39.2 C) (Oral)    Resp 18    LMP 02/02/2017    SpO2 97%   Physical Exam Vitals and nursing note reviewed.  Constitutional:      General: She is not in acute distress.    Appearance: She is well-developed.  HENT:     Head: Normocephalic and atraumatic.  Eyes:     Conjunctiva/sclera: Conjunctivae normal.  Cardiovascular:     Rate and Rhythm: Regular  rhythm. Tachycardia present.     Heart sounds: No murmur heard. Pulmonary:     Effort: Pulmonary effort is normal. No respiratory distress.     Breath sounds: Normal breath sounds.  Abdominal:     Palpations: Abdomen is soft.     Tenderness: There is abdominal tenderness.     Comments: Mild suprapubic tenderness  Musculoskeletal:        General: No swelling. Normal range of motion.     Cervical back: Neck supple.  Skin:    General: Skin is warm and dry.     Capillary Refill: Capillary refill takes less than 2 seconds.  Neurological:     Mental Status: She is alert and oriented to person, place, and time.  Psychiatric:        Mood and Affect: Mood normal.    ED Results / Procedures / Treatments   Labs (all labs ordered are listed, but only abnormal results are displayed) Labs Reviewed  CULTURE, BLOOD (ROUTINE X 2)  CULTURE, BLOOD (ROUTINE X 2)  URINE CULTURE  RESP PANEL BY RT-PCR (FLU A&B, COVID) ARPGX2  CBC WITH DIFFERENTIAL/PLATELET  LACTIC ACID, PLASMA  COMPREHENSIVE METABOLIC PANEL  URINALYSIS, ROUTINE W REFLEX MICROSCOPIC  PROTIME-INR  APTT  I-STAT BETA HCG BLOOD, ED ([facility], [facility], [facility] ONLY)    EKG None  Radiology No results found.  Procedures .Critical Care Performed by: Roxy Horseman, PA-C Authorized by: Roxy Horseman, PA-C   Critical care provider statement:    Critical care time (minutes):  55   Critical care was necessary to treat or prevent imminent or life-threatening deterioration of the following conditions:  Sepsis   Critical care was time spent personally by me on the following activities:  Development of treatment plan with patient or surrogate, discussions with consultants, evaluation of patient's response to treatment, examination of patient, ordering and review of laboratory studies, ordering and review of radiographic studies, ordering and performing treatments and interventions, pulse oximetry, re-evaluation of patient's condition and review of  old charts   Medications Ordered in ED Medications  acetaminophen (TYLENOL) tablet 1,000 mg (has no administration in time range)  lactated ringers infusion (has no administration in time range)  ceFEPIme (MAXIPIME) 2 g in sodium chloride 0.9 % 100 mL IVPB (has no administration in time range)  metroNIDAZOLE (FLAGYL) IVPB 500 mg (has no administration in time range)  vancomycin (VANCOCIN) IVPB 1000 mg/200 mL premix (has no administration in time range)  lactated ringers bolus 1,000 mL (has no administration in time range)    ED Course  I have reviewed the triage vital signs and the nursing notes.  Pertinent labs & imaging results that were available during my care of the patient were reviewed by me and considered in my medical decision making (see chart for details).    MDM Rules/Calculators/A&P                         Patient here with generalized weakness and suprapubic discomfort x1 day.  She is noted to be febrile in triage and meets SIRS criteria.  Code sepsis was activated.  Patient was started on broad-spectrum antibiotics.  She was given weight-based fluids.  She does not have any significant leukocytosis.  She does have some signs of dehydration on her CMP.  K is 2.8, creatinine 1.06.  Urinalysis is suspicious for infection.  COVID test is positive.  Patient denies cough.  Despite weight-based fluids, patient remains hypotensive.  She is not altered.  She has had normal lactic acid.  Will consult hospitalist for admission.  Appreciate Dr. Leafy Half for admitting.  Patient seen by and discussed with Dr. Pilar Plate, who agrees with plan.    Final Clinical Impression(s) / ED Diagnoses Final diagnoses:  COVID  Acute cystitis without hematuria    Rx / DC Orders ED Discharge Orders     None        Roxy Horseman, PA-C 06/29/21 0354    Sabas Sous, MD 06/29/21 [phone]

## 2021-06-29 NOTE — H&P (Signed)
History and Physical    Heather Poole WUJ:[id number] DOB: 30-Apr-1971 DOA: 06/28/2021  PCP: Patient, No Pcp Per (Inactive)  Patient coming from: Home   Chief Complaint:  Chief Complaint  Patient presents with   Weakness     HPI:    50 year old female with past medical history of diabetes mellitus type 2 presenting to [facility] emergency department with 2-day history of malaise and dysuria.  Patient explains that approximately 2 days ago she began to experience dysuria.  Patient complains that approximately the same time she began to experience dysuria.  Patient also began to experience lower abdominal pain and left-sided abdominal pain.  Patient describes this pain as sharp in quality, mild to moderate intensity without any alleviating or exacerbating factors.  As patient's symptoms worsened she developed chills with generalized malaise and mild weakness.  Patient denies any nausea, vomiting, sick contacts, recent travel or contact with confirmed COVID-19 infection.    Due to progressively worsening symptoms patient presented to [facility] emergency department for evaluation.  Upon evaluation in the emergency department patient was found to be febrile on arrival with temperature 102.6 F with associated tachycardia.  Patient was additionally found to be hypotensive with initial systolic blood pressures in the 80s.  Patient was given 3 L of intravenous isotonic fluids via bolusing and initiated on broad-spectrum intravenous antibiotic therapy with intravenous cefepime and vancomycin.  Urinalysis was found to be suggestive of urinary tract infection.  The hospitalist group was then called to assess the patient for admission to the hospital.  Review of Systems:   Review of Systems  Constitutional:  Positive for malaise/fatigue.  Gastrointestinal:  Positive for abdominal pain.  Genitourinary:  Positive for dysuria.   History reviewed. No pertinent past medical  history.  History reviewed. No pertinent surgical history.   reports that she has never smoked. She has never used smokeless tobacco. She reports that she does not drink alcohol and does not use drugs.  No Known Allergies  Family History  Problem Relation Age of Onset   Heart disease Neg Hx      Prior to Admission medications   Medication Sig Start Date End Date Taking? Authorizing Provider  nitrofurantoin, macrocrystal-monohydrate, (MACROBID) 100 MG capsule Take 1 capsule (100 mg total) by mouth 2 (two) times daily. 01/07/20   Merrilee Jansky, MD    Physical Exam: Vitals:   06/29/21 0230 06/29/21 0245 06/29/21 0300 06/29/21 0315  BP: (!) 92/56 (!) 88/56 (!) 91/56 93/60  Pulse: 85 84 82 83  Resp: 20 19 (!) 21 20  Temp:      TempSrc:      SpO2: 99% 98% 98% 98%  Weight:      Height:        Constitutional: Awake alert and oriented x3, no associated distress.   Skin: no rashes, no lesions, Poor skin turgor noted. Eyes: Pupils are equally reactive to light.  No evidence of scleral icterus or conjunctival pallor.  ENMT: dry mucous membranes noted.  Posterior pharynx clear of any exudate or lesions.   Neck: normal, supple, no masses, no thyromegaly.  No evidence of jugular venous distension.   Respiratory: clear to auscultation bilaterally, no wheezing, no crackles. Normal respiratory effort. No accessory muscle use.  Cardiovascular: Regular rate and rhythm, no murmurs / rubs / gallops. No extremity edema. 2+ pedal pulses. No carotid bruits.  Chest:   Nontender without crepitus or deformity.   Back:   Nontender without crepitus or deformity.  Abdomen: Notable left-sided and suprapubic tenderness.  Abdomen is soft however.  Abdomen is soft and nontender.  No evidence of intra-abdominal masses.  Positive bowel sounds noted in all quadrants.   Musculoskeletal: No joint deformity upper and lower extremities. Good ROM, no contractures. Normal muscle tone.  Neurologic: CN 2-12 grossly  intact. Sensation intact.  Patient moving all 4 extremities spontaneously.  Patient is following all commands.  Patient is responsive to verbal stimuli.   Psychiatric: Patient exhibits normal mood with appropriate affect.  Patient seems to possess insight as to their current situation.     Labs on Admission: I have personally reviewed following labs and imaging studies -   CBC: Recent Labs  Lab 06/29/21 0003  WBC 7.5  NEUTROABS 6.4  HGB 11.3*  HCT 33.6*  MCV 83.2  PLT 120*   Basic Metabolic Panel: Recent Labs  Lab 06/29/21 0003  NA 134*  K 2.8*  CL 103  CO2 25  GLUCOSE 215*  BUN 13  CREATININE 1.06*  CALCIUM 8.0*   GFR: Estimated Creatinine Clearance: 51.2 mL/min (A) (by C-G formula based on SCr of 1.06 mg/dL (H)). Liver Function Tests: Recent Labs  Lab 06/29/21 0003  AST 35  ALT 20  ALKPHOS 72  BILITOT 0.7  PROT 6.5  ALBUMIN 3.2*   No results for input(s): LIPASE, AMYLASE in the last 168 hours. No results for input(s): AMMONIA in the last 168 hours. Coagulation Profile: Recent Labs  Lab 06/29/21 0012  INR 1.1   Cardiac Enzymes: No results for input(s): CKTOTAL, CKMB, CKMBINDEX, TROPONINI in the last 168 hours. BNP (last 3 results) No results for input(s): PROBNP in the last 8760 hours. HbA1C: No results for input(s): HGBA1C in the last 72 hours. CBG: No results for input(s): GLUCAP in the last 168 hours. Lipid Profile: No results for input(s): CHOL, HDL, LDLCALC, TRIG, CHOLHDL, LDLDIRECT in the last 72 hours. Thyroid Function Tests: No results for input(s): TSH, T4TOTAL, FREET4, T3FREE, THYROIDAB in the last 72 hours. Anemia Panel: No results for input(s): VITAMINB12, FOLATE, FERRITIN, TIBC, IRON, RETICCTPCT in the last 72 hours. Urine analysis:    Component Value Date/Time   COLORURINE YELLOW 06/28/2021 2339   APPEARANCEUR HAZY (A) 06/28/2021 2339   LABSPEC 1.010 06/28/2021 2339   PHURINE 7.0 06/28/2021 2339   GLUCOSEU NEGATIVE 06/28/2021 2339    HGBUR TRACE (A) 06/28/2021 2339   BILIRUBINUR NEGATIVE 06/28/2021 2339   KETONESUR NEGATIVE 06/28/2021 2339   PROTEINUR NEGATIVE 06/28/2021 2339   UROBILINOGEN 0.2 01/05/2020 1406   NITRITE NEGATIVE 06/28/2021 2339   LEUKOCYTESUR MODERATE (A) 06/28/2021 2339    Radiological Exams on Admission - Personally Reviewed: DG Chest 1 View  Result Date: 06/29/2021 CLINICAL DATA:  Fever and chills. EXAM: CHEST  1 VIEW COMPARISON:  05/29/2014. FINDINGS: The heart size and mediastinal contours are within normal limits. Both lungs are clear. No acute osseous abnormality. IMPRESSION: No acute cardiopulmonary process. Electronically Signed   By: Thornell Sartorius M.D.   On: 06/29/2021 01:48   CT ABDOMEN PELVIS W CONTRAST  Result Date: 06/29/2021 CLINICAL DATA:  Abdominal pain, acute, nonlocalized. EXAM: CT ABDOMEN AND PELVIS WITH CONTRAST TECHNIQUE: Multidetector CT imaging of the abdomen and pelvis was performed using the standard protocol following bolus administration of intravenous contrast. CONTRAST:  79mL OMNIPAQUE IOHEXOL 300 MG/ML  SOLN COMPARISON:  02/22/2018. FINDINGS: Lower chest: Mild dependent atelectasis at the lung bases. Hepatobiliary: No focal liver abnormality is seen. No gallstones, gallbladder wall thickening, or biliary dilatation. Pancreas: Unremarkable.  No pancreatic ductal dilatation or surrounding inflammatory changes. Spleen: Normal in size without focal abnormality. Adrenals/Urinary Tract: Adrenal glands are unremarkable. Kidneys are normal, without renal calculi, focal lesion, or hydronephrosis. The kidneys enhance symmetrically. Bladder is unremarkable. Stomach/Bowel: No bowel obstruction, free air or pneumatosis. There is mild gastric wall thickening. The appendix is not clearly visualized on exam. Vascular/Lymphatic: Aortic atherosclerosis. No enlarged abdominal or pelvic lymph nodes. Reproductive: Uterus and bilateral adnexa are unremarkable. Other: Trace amount of free fluid in the  right lower quadrant. Small fat containing umbilical hernia. Musculoskeletal: Mild degenerative changes in the thoracolumbar spine. No acute osseous abnormality. IMPRESSION: 1. No bowel obstruction or free air. The appendix is not definitely visualized on exam. If there is clinical concern for appendicitis, repeat evaluation with oral contrast is suggested. 2. Mild gastric wall thickening, possible gastritis. 3. Trace amount of free fluid in the right lower quadrant. Electronically Signed   By: Thornell Sartorius M.D.   On: 06/29/2021 04:47    EKG: Personally reviewed.  Rhythm is normal sinus rhythm with heart rate of 97 bpm.  No dynamic ST segment changes appreciated.  Assessment/Plan  * Sepsis secondary to UTI [facility]) Patient presenting with several days of malaise weakness and chills with evidence of left-sided abdominal tenderness on exam  multiple SIRS criteria including fever and tachycardia in the setting of urinary tract infection and severe hypotension all concerning for developing sepsis with possible early septic shock Initiating broad-spectrum intravenous antibiotic therapy to cover for possible gram-negative organisms and anaerobes with intravenous cefepime and metronidazole Obtaining CT imaging of the abdomen and pelvis to identify for any foci of infection amenable to intervention Aggressive intravenous volume resuscitation with isotonic fluids.  Patient initially provided greater than 30 cc/kg of fluid boluses followed by isotonic fluid infusion. Blood and urine cultures have been obtained Placing patient in progressive unit for close clinical monitoring Lactic acid unremarkable Obtaining cortisol level, CRP, procalcitonin  COVID-19 virus infection COVID-19 PCR testing positive. In the absence of chest infiltrates or hypoxia this diagnosis is felt to be incidental. Considering comorbidity of diabetes patient is at high risk of progression and therefore a 3-day course of Paxlovid twice  daily will be initiated. Airborne and contact isolation initiated. As needed bronchodilator therapy for shortness of breath and wheezing  As needed antitussives for cough  Hydrating patient gently with intravenous isotonic fluids  Following CRP, D-dimer,  CBC daily Close clinical monitoring    Type 2 diabetes mellitus with hyperglycemia, without long-term current use of insulin (HCC) Patient reports history of diabetes although she cannot recall what medication she takes in the outpatient setting Patient been placed on Accu-Cheks before every meal and nightly with sliding scale insulin Hemoglobin A1C ordered Diabetic Diet   Thrombocytopenia (HCC) Likely secondary to underlying infection No clinical evidence of bleeding We will continue to monitor as we treat underlying infection as platelet count should recover Continue to monitor with serial CBCs  Hypokalemia Replacing with potassium chloride Evaluating for concurrent hypomagnesemia  Monitoring potassium levels with serial chemistries.        Code Status:  Full code  code status decision has been confirmed with: Patient Family Communication: Son and sister are at the bedside who has been updated on plan of care.    Status is: Inpatient  Remains inpatient appropriate because: Sepsis with hypotension and risk of developing septic shock requiring broad-spectrum intravenous antibiotics, aggressive intravenous volume resuscitation and placement in the progressive unit for close clinical monitoring, serial blood tests  and serial clinical examinations.        Marinda Elk MD [facility] Pager [phone]  If 7PM-7AM, please contact night-coverage [email or web address] Use universal [place] password for that web site. If you do not have the password, please call the hospital operator.  06/29/2021, 5:23 AM

## 2021-06-29 NOTE — Assessment & Plan Note (Signed)
•   Patient reports history of diabetes although she cannot recall what medication she takes in the outpatient setting  Patient been placed on Accu-Cheks before every meal and nightly with sliding scale insulin  Hemoglobin A1C ordered  Diabetic Diet

## 2021-06-29 NOTE — Progress Notes (Signed)
PHARMACY NOTE:  ANTIMICROBIAL RENAL DOSAGE ADJUSTMENT  Current antimicrobial regimen includes a mismatch between antimicrobial dosage and estimated renal function.  As per policy approved by the Pharmacy & Therapeutics and Medical Executive Committees, the antimicrobial dosage will be adjusted accordingly.  Current antimicrobial dosage:  meropenem 1g Q12 hr  Indication: ESBL UTI  Renal Function:  Estimated Creatinine Clearance: 58.4 mL/min (by C-G formula based on SCr of 0.93 mg/dL).     Antimicrobial dosage has been changed to:  1g Q8 hr   Additional comments: Renal function improved with fluids   Thank you for allowing pharmacy to be a part of this patient's care.  Alphia Moh, PharmD, BCPS, BCCP Clinical Pharmacist  Please check AMION for all [facility] Pharmacy phone numbers After 10:00 PM, call Main Pharmacy [phone]

## 2021-06-29 NOTE — Progress Notes (Addendum)
PROGRESS NOTE    Heather Poole  [id number] DOB: 11/14/1970 DOA: 06/28/2021 PCP: Patient, No Pcp Per (Inactive)   Brief Narrative: 50 year old with past medical history significant for diabetes type 2 presented to [facility] with 2 days history of malaise, dysuria.  Patient also reported lower quadrant and left-sided abdominal pain.  Evaluation in the ED patient was found to be febrile temperature 102 hypotensive systolic blood pressure in the 80s.  She has received 3 L of IV fluids in the ED.  She remained hypotensive.  She is admitted with sepsis secondary to UTI, prior history of ESBL in urine.  CCM has been consulted, patient might require IV pressors.  Of note patient was also found to have COVID 19.   Assessment & Plan:   Principal Problem:   Sepsis secondary to UTI [facility]) Active Problems:   Hypokalemia   COVID-19 virus infection   Thrombocytopenia (HCC)   Type 2 diabetes mellitus with hyperglycemia, without long-term current use of insulin (HCC)  1-Sepsis: secondary to UTI -Patient presents with fever, hypotension systolic blood pressure in the 80s, elevation of procalcitonin at 28, UA more than 50 white blood cell. -Prior history of ESBL in urine -Start meropenem -She has received 3 L of IV fluids.  We will proceed with another bolus of IV fluids. -Consulted, patient may require IV pressors -Cortisol level at 17, not low. -Follow blood cultures.  Follow urine culture CT abdomen pelvis: No bowel obstruction or free air. The appendix is not definitely visualized on exam. If there is clinical concern for appendicitis, repeat evaluation with oral contrast is suggested. Mild gastric wall thickening, possible gastritis. Trace amount of free fluid in the right lower quadrant. -patient denies right LQ pain.  -start PPI for gastritis.   2-Covid 19 virus infection -Patient was a started on Paxlovid.  -Follow inflammatory markers -Remain on room air  3-Type 2 Diabetes with  hyperglycemia: Continue with a sliding scale insulin  Thrombocytopenia: Likely secondary to sepsis  Hypokalemia: Replete orally  Hypomagnesemia: replete IV.  Mild transaminases; monitor.   Estimated body mass index is 25.56 kg/m as calculated from the following:   Height as of this encounter: 5' (1.524 m).   Weight as of this encounter: 59.4 kg.   DVT prophylaxis: Lovenox Code Status: Full code Family Communication: Care discussed with patient and son who was at bedside.  Disposition Plan:  Status is: Inpatient  Remains inpatient appropriate because: patient admitted with sepsis, requiring IV fluids and IV antibiotics.         Consultants:  CCM  Procedures:  ECHO  Antimicrobials:    Subjective: She is alert, denies dizziness. Denies cough, runny nose.    Objective: Vitals:   06/29/21 0445 06/29/21 0500 06/29/21 0515 06/29/21 0530  BP: (!) 91/51   (!) 84/61  Pulse: 81 77 80 78  Resp: (!) 21 (!) 21 19 20   Temp:      TempSrc:      SpO2: 100% 100% 99% 100%  Weight:      Height:        Intake/Output Summary (Last 24 hours) at 06/29/2021 0732 Last data filed at 06/29/2021 0631 Gross per 24 hour  Intake 2743.92 ml  Output --  Net 2743.92 ml   Filed Weights   06/29/21 0139  Weight: 59.4 kg    Examination:  General exam: Appears calm and comfortable  Respiratory system: Clear to auscultation. Respiratory effort normal. Cardiovascular system: S1 & S2 heard, RRR.  Gastrointestinal system: Abdomen  is nondistended, soft and nontender. No organomegaly or masses felt. Normal bowel sounds heard. Central nervous system: Alert and oriented. No focal neurological deficits. Extremities: Symmetric 5 x 5 power. Skin: No rashes, lesions or ulcers   Data Reviewed: I have personally reviewed following labs and imaging studies  CBC: Recent Labs  Lab 06/29/21 0003  WBC 7.5  NEUTROABS 6.4  HGB 11.3*  HCT 33.6*  MCV 83.2  PLT 120*   Basic Metabolic  Panel: Recent Labs  Lab 06/29/21 0003 06/29/21 0502  NA 134* 134*  K 2.8* 3.1*  CL 103 104  CO2 25 23  GLUCOSE 215* 229*  BUN 13 11  CREATININE 1.06* 0.93  CALCIUM 8.0* 7.6*  MG  --  1.4*   GFR: Estimated Creatinine Clearance: 58.4 mL/min (by C-G formula based on SCr of 0.93 mg/dL). Liver Function Tests: Recent Labs  Lab 06/29/21 0003 06/29/21 0502  AST 35 71*  ALT 20 41  ALKPHOS 72 73  BILITOT 0.7 0.8  PROT 6.5 4.8*  ALBUMIN 3.2* 2.5*   No results for input(s): LIPASE, AMYLASE in the last 168 hours. No results for input(s): AMMONIA in the last 168 hours. Coagulation Profile: Recent Labs  Lab 06/29/21 0012  INR 1.1   Cardiac Enzymes: No results for input(s): CKTOTAL, CKMB, CKMBINDEX, TROPONINI in the last 168 hours. BNP (last 3 results) No results for input(s): PROBNP in the last 8760 hours. HbA1C: Recent Labs    06/29/21 0502  HGBA1C 6.2*   CBG: No results for input(s): GLUCAP in the last 168 hours. Lipid Profile: No results for input(s): CHOL, HDL, LDLCALC, TRIG, CHOLHDL, LDLDIRECT in the last 72 hours. Thyroid Function Tests: No results for input(s): TSH, T4TOTAL, FREET4, T3FREE, THYROIDAB in the last 72 hours. Anemia Panel: No results for input(s): VITAMINB12, FOLATE, FERRITIN, TIBC, IRON, RETICCTPCT in the last 72 hours. Sepsis Labs: Recent Labs  Lab 06/29/21 0003 06/29/21 0502  PROCALCITON  --  28.23  LATICACIDVEN 1.3  --     Recent Results (from the past 240 hour(s))  Resp Panel by RT-PCR (Flu A&B, Covid) Peripheral     Status: Abnormal   Collection Time: 06/28/21 11:39 PM   Specimen: Peripheral; Nasopharyngeal(NP) swabs in vial transport medium  Result Value Ref Range Status   SARS Coronavirus 2 by RT PCR POSITIVE (A) NEGATIVE Final    Comment: (NOTE) SARS-CoV-2 target nucleic acids are DETECTED.  The SARS-CoV-2 RNA is generally detectable in upper respiratory specimens during the acute phase of infection. Positive results  are indicative of the presence of the identified virus, but do not rule out bacterial infection or co-infection with other pathogens not detected by the test. Clinical correlation with patient history and other diagnostic information is necessary to determine patient infection status. The expected result is Negative.  Fact Sheet for Patients: BloggerCourse.com  Fact Sheet for Healthcare Providers: SeriousBroker.it  This test is not yet approved or cleared by the [place] FDA and  has been authorized for detection and/or diagnosis of SARS-CoV-2 by FDA under an Emergency Use Authorization (EUA).  This EUA will remain in effect (meaning this test can be used) for the duration of  the COVID-19 declaration under Section 564(b)(1) of the A ct, 21 U.S.C. section 360bbb-3(b)(1), unless the authorization is terminated or revoked sooner.     Influenza A by PCR NEGATIVE NEGATIVE Final   Influenza B by PCR NEGATIVE NEGATIVE Final    Comment: (NOTE) The Xpert Xpress SARS-CoV-2/FLU/RSV plus assay is intended as an  aid in the diagnosis of influenza from Nasopharyngeal swab specimens and should not be used as a sole basis for treatment. Nasal washings and aspirates are unacceptable for Xpert Xpress SARS-CoV-2/FLU/RSV testing.  Fact Sheet for Patients: BloggerCourse.com  Fact Sheet for Healthcare Providers: SeriousBroker.it  This test is not yet approved or cleared by the [place] FDA and has been authorized for detection and/or diagnosis of SARS-CoV-2 by FDA under an Emergency Use Authorization (EUA). This EUA will remain in effect (meaning this test can be used) for the duration of the COVID-19 declaration under Section 564(b)(1) of the Act, 21 U.S.C. section 360bbb-3(b)(1), unless the authorization is terminated or revoked.  Performed at [facility], [address] [place], [place] [address]          Radiology Studies: DG Chest 1 View  Result Date: 06/29/2021 CLINICAL DATA:  Fever and chills. EXAM: CHEST  1 VIEW COMPARISON:  05/29/2014. FINDINGS: The heart size and mediastinal contours are within normal limits. Both lungs are clear. No acute osseous abnormality. IMPRESSION: No acute cardiopulmonary process. Electronically Signed   By: Thornell Sartorius M.D.   On: 06/29/2021 01:48   CT ABDOMEN PELVIS W CONTRAST  Result Date: 06/29/2021 CLINICAL DATA:  Abdominal pain, acute, nonlocalized. EXAM: CT ABDOMEN AND PELVIS WITH CONTRAST TECHNIQUE: Multidetector CT imaging of the abdomen and pelvis was performed using the standard protocol following bolus administration of intravenous contrast. CONTRAST:  50mL OMNIPAQUE IOHEXOL 300 MG/ML  SOLN COMPARISON:  02/22/2018. FINDINGS: Lower chest: Mild dependent atelectasis at the lung bases. Hepatobiliary: No focal liver abnormality is seen. No gallstones, gallbladder wall thickening, or biliary dilatation. Pancreas: Unremarkable. No pancreatic ductal dilatation or surrounding inflammatory changes. Spleen: Normal in size without focal abnormality. Adrenals/Urinary Tract: Adrenal glands are unremarkable. Kidneys are normal, without renal calculi, focal lesion, or hydronephrosis. The kidneys enhance symmetrically. Bladder is unremarkable. Stomach/Bowel: No bowel obstruction, free air or pneumatosis. There is mild gastric wall thickening. The appendix is not clearly visualized on exam. Vascular/Lymphatic: Aortic atherosclerosis. No enlarged abdominal or pelvic lymph nodes. Reproductive: Uterus and bilateral adnexa are unremarkable. Other: Trace amount of free fluid in the right lower quadrant. Small fat containing umbilical hernia. Musculoskeletal: Mild degenerative changes in the thoracolumbar spine. No acute osseous abnormality. IMPRESSION: 1. No bowel obstruction or free air. The appendix is not definitely visualized on  exam. If there is clinical concern for appendicitis, repeat evaluation with oral contrast is suggested. 2. Mild gastric wall thickening, possible gastritis. 3. Trace amount of free fluid in the right lower quadrant. Electronically Signed   By: Thornell Sartorius M.D.   On: 06/29/2021 04:47        Scheduled Meds:  albuterol  2 puff Inhalation Q6H   vitamin C  500 mg Oral Daily   enoxaparin (LOVENOX) injection  40 mg Subcutaneous Daily   insulin aspart  0-15 Units Subcutaneous TID AC & HS   nirmatrelvir/ritonavir EUA  3 tablet Oral BID   potassium chloride  40 mEq Oral Q4H   zinc sulfate  220 mg Oral Daily   Continuous Infusions:  ceFEPime (MAXIPIME) IV     lactated ringers 125 mL/hr at 06/29/21 0525   magnesium sulfate bolus IVPB     metronidazole Stopped (06/29/21 0631)     LOS: 0 days    Time spent: 35 minutes.     Alba Cory, MD [facility]   If 7PM-7AM, please contact night-coverage [email or web address]  06/29/2021, 7:32 AM

## 2021-06-29 NOTE — ED Notes (Signed)
Pt bp at 86/57 at this time. Alert and oriented x 4. MD notified.

## 2021-06-29 NOTE — Consult Note (Signed)
NAME:  Heather Poole, MRN:  [id number], DOB:  1970-07-23, LOS: 0 ADMISSION DATE:  06/28/2021, CONSULTATION DATE: 06/30/2019 REFERRING MD: Triad, CHIEF COMPLAINT: Refractory hypotension  History of Present Illness:  -year-old female with a history of UTIs who presents with left lower abdominal pain and painful urination without fevers chills sweats or productive cough.  She is a COVID positive is an incidental finding..  Urine is dirty cloudy mucus and bacteria.  She was started on antimicrobial therapy and fluid resuscitation was a slightly elevated procalcitonin and normal lactic acid.  Pulmonary critical care was called for refractory hypotension she started on the fourth liter of IV fluids blood pressure is now 100/56 with a heart rate of 74.  She can be admitted to the stepdown unit for close observation.  There may be a need for peripheral vasopressors in the future.  Pulmonary critical care will be available as needed.  Pertinent  Medical History  Positive for history of urinary tract infection with ESBL Lower extremity edema for which she takes Demadex and had a 2D echo performed in 2009 with trivial area   Significant Hospital Events: Including procedures, antibiotic start and stop dates in addition to other pertinent events   06/29/2021 antimicrobial therapy started 06/29/2021 fluid resuscitation 06/29/2021 PCCM consult  Interim History / Subjective:  51 year old female with a history of ESBL UTI.  Presents with fever no chills no sweats positive lower left abdominal pain and painful urination  Objective   Blood pressure (!) 87/64, pulse 79, temperature 98.9 F (37.2 C), temperature source Oral, resp. rate 16, height 5' (1.524 m), weight 59.4 kg, last menstrual period 02/02/2017, SpO2 100 %.        Intake/Output Summary (Last 24 hours) at 06/29/2021 0839 Last data filed at 06/29/2021 7517 Gross per 24 hour  Intake 3743.92 ml  Output --  Net 3743.92 ml   Filed Weights    06/29/21 0139  Weight: 59.4 kg    Examination: General: Well-nourished well-developed female HENT: No JVD or lymphadenopathy Lungs: Huel Cote to auscultation Cardiovascular: Irregular regular rate and rhythm sinus rhythm rate of 74 Abdomen: Tender left lower quadrant, positive bowel sounds Extremities: 2+ edema lower extremities warm and nontender Neuro: Grossly intact without focal defect there is language barrier GU: Painful urination, urine is noted to be dark.  Resolved Hospital Problem list     Assessment & Plan:  Shock/sepsis in the setting of most likely recurrent urinary tract infection with elevated procalcitonin, and hypotension that is now responding to fluids and antimicrobial therapy.  Note has a history of urine with ESBL. Agree with fluid resuscitation Does not require pressors at this time Agree with antimicrobial therapy Continue to monitor and is acceptable for stepdown unit May need peripheral vasopressors in the future continue to monitor  History of hypothyroidism Check TSH  Abdominal pain Check CT of abdomen  Was on diuretics at home Last 2D echo was done 02/23/2008 showed normal LV function Consider repeating 2D echo She has been seen by cardiology in the past Dr. Carolanne Grumbling  COVID- positive incidental finding Continue isolation   Best Practice (right click and "Reselect all SmartList Selections" daily)   Diet/type: Regular consistency (see orders) DVT prophylaxis: LMWH GI prophylaxis: PPI Lines: N/A Foley:  N/A Code Status:  full code Last date of multidisciplinary goals of care discussion [tbd]  Labs   CBC: Recent Labs  Lab 06/29/21 0003 06/29/21 0502  WBC 7.5 5.2  NEUTROABS 6.4 4.2  HGB 11.3* 10.2*  HCT 33.6* 31.3*  MCV 83.2 85.1  PLT 120* 91*    Basic Metabolic Panel: Recent Labs  Lab 06/29/21 0003 06/29/21 0502  NA 134* 134*  K 2.8* 3.1*  CL 103 104  CO2 25 23  GLUCOSE 215* 229*  BUN 13 11  CREATININE 1.06* 0.93   CALCIUM 8.0* 7.6*  MG  --  1.4*   GFR: Estimated Creatinine Clearance: 58.4 mL/min (by C-G formula based on SCr of 0.93 mg/dL). Recent Labs  Lab 06/29/21 0003 06/29/21 0502  PROCALCITON  --  28.23  WBC 7.5 5.2  LATICACIDVEN 1.3  --     Liver Function Tests: Recent Labs  Lab 06/29/21 0003 06/29/21 0502  AST 35 71*  ALT 20 41  ALKPHOS 72 73  BILITOT 0.7 0.8  PROT 6.5 4.8*  ALBUMIN 3.2* 2.5*   No results for input(s): LIPASE, AMYLASE in the last 168 hours. No results for input(s): AMMONIA in the last 168 hours.  ABG    Component Value Date/Time   TCO2 23 02/22/2008 1947     Coagulation Profile: Recent Labs  Lab 06/29/21 0012  INR 1.1    Cardiac Enzymes: No results for input(s): CKTOTAL, CKMB, CKMBINDEX, TROPONINI in the last 168 hours.  HbA1C: Hgb A1c MFr Bld  Date/Time Value Ref Range Status  06/29/2021 05:02 AM 6.2 (H) 4.8 - 5.6 % Final    Comment:    (NOTE) Pre diabetes:          5.7%-6.4%  Diabetes:              >6.4%  Glycemic control for   <7.0% adults with diabetes     CBG: Recent Labs  Lab 06/29/21 0804  GLUCAP 143*    Review of Systems:   10 point review of system taken, please see HPI for positives and negatives. Positive for lower extremity edema that she is on Demadex daily Positive painful urination x4 days 4 days of left lower abdominal pain  Past Medical History:  She,  has no past medical history on file.  Hypothyroidism Congestive heart failure Type 2 diabetes History ESBL UTI Surgical History:  History reviewed. No pertinent surgical history.   Social History:   reports that she has never smoked. She has never used smokeless tobacco. She reports that she does not drink alcohol and does not use drugs.   Family History:  Her family history is negative for Heart disease.   Allergies No Known Allergies   Home Medications  Prior to Admission medications   Medication Sig Start Date End Date Taking? Authorizing  Provider  KLOR-CON M20 20 MEQ tablet Take 20 mEq by mouth daily. 06/05/21   [provider]  levothyroxine (SYNTHROID) 50 MCG tablet Take 50 mcg by mouth daily. 06/04/21   [provider]  torsemide (DEMADEX) 20 MG tablet Take 20 mg by mouth daily as needed. 06/04/21   [provider]  Vitamin D, Ergocalciferol, (DRISDOL) 1.25 MG (50000 UNIT) CAPS capsule Take 50,000 Units by mouth once a week. 06/04/21   [provider]     Critical care time: 24 min    Brett Canales Monifa Blanchette ACNP Acute Care Nurse Practitioner [facility] Please consult Amion 06/29/2021, 8:39 AM

## 2021-06-29 NOTE — Progress Notes (Signed)
°   06/29/21 2100  Vitals  Temp (!) 103 F (39.4 C)  Temp Source Oral  BP (!) 136/55  MAP (mmHg) 78  BP Location Left Arm  BP Method Automatic  Patient Position (if appropriate) Lying  Pulse Rate (!) 101  Pulse Rate Source Monitor  ECG Heart Rate (!) 101  Resp (!) 25  Oxygen Therapy  SpO2 95 %  O2 Device Room Air  Pain Assessment  Pain Scale 0-10  Pain Score 0  MEWS Score  MEWS Temp 2  MEWS Systolic 0  MEWS Pulse 1  MEWS RR 1  MEWS LOC 0  MEWS Score 4  MEWS Score Color Red  Provider Notification  Provider Name/Title Dr. Julian Reil  Date Provider Notified 06/29/21  Time Provider Notified 2030  Notification Type Page  Notification Reason Critical result  Test performed and critical result Lactic Acid 2.7  Date Critical Result Received 06/29/21  Time Critical Result Received 2020  Provider response See new orders  Date of Provider Response 06/29/21  Time of Provider Response 2040   Orders to increase lactate ringers and lactic acid lab draw at 2200

## 2021-06-29 NOTE — ED Notes (Signed)
Phlebotomist unable to draw TSH and repeat lactic acid. RN attempted to stick pt twice with no success. Will notify MD.

## 2021-06-29 NOTE — Assessment & Plan Note (Addendum)
·   COVID-19 PCR testing positive.  In the absence of chest infiltrates or hypoxia this diagnosis is felt to be incidental.  Considering comorbidity of diabetes patient is at high risk of progression and therefore a 3-day course of Paxlovid twice daily will be initiated.  Airborne and contact isolation initiated.  As needed bronchodilator therapy for shortness of breath and wheezing   As needed antitussives for cough   Hydrating patient gently with intravenous isotonic fluids   Following CRP, D-dimer,  CBC daily  Close clinical monitoring

## 2021-06-29 NOTE — Progress Notes (Signed)
Elink following code sepsis °

## 2021-06-29 NOTE — Progress Notes (Signed)
An USGPIV (ultrasound guided PIV) was placed for short-term vasopressor infusion. A correctly placed ivWatch must be used when administering Vasopressors. Should this treatment be needed beyond 72 hours, central line access should be obtained.  It will be the responsibility of the bedside nurse to follow best practice to prevent extravasations.

## 2021-06-29 NOTE — Assessment & Plan Note (Signed)
·   Likely secondary to underlying infection  No clinical evidence of bleeding  We will continue to monitor as we treat underlying infection as platelet count should recover  Continue to monitor with serial CBCs

## 2021-06-29 NOTE — ED Notes (Signed)
Phlebotomist is coming to draw scheduled labs at this time.

## 2021-06-29 NOTE — Assessment & Plan Note (Addendum)
·   Patient presenting with several days of malaise weakness and chills with evidence of left-sided abdominal tenderness on exam   multiple SIRS criteria including fever and tachycardia in the setting of urinary tract infection and severe hypotension all concerning for developing sepsis with possible early septic shock  Initiating broad-spectrum intravenous antibiotic therapy to cover for possible gram-negative organisms and anaerobes with intravenous cefepime and metronidazole  Obtaining CT imaging of the abdomen and pelvis to identify for any foci of infection amenable to intervention  Aggressive intravenous volume resuscitation with isotonic fluids.  Patient initially provided greater than 30 cc/kg of fluid boluses followed by isotonic fluid infusion.  Blood and urine cultures have been obtained  Placing patient in progressive unit for close clinical monitoring  Lactic acid unremarkable  Obtaining cortisol level, CRP, procalcitonin

## 2021-06-29 NOTE — Progress Notes (Signed)
°  Echocardiogram 2D Echocardiogram has been performed.  Heather Poole 06/29/2021, 11:51 AM

## 2021-06-30 LAB — COMPREHENSIVE METABOLIC PANEL
ALT: 40 U/L (ref 0–44)
AST: 50 U/L — ABNORMAL HIGH (ref 15–41)
Albumin: 2.3 g/dL — ABNORMAL LOW (ref 3.5–5.0)
Alkaline Phosphatase: 72 U/L (ref 38–126)
Anion gap: 3 — ABNORMAL LOW (ref 5–15)
BUN: 5 mg/dL — ABNORMAL LOW (ref 6–20)
CO2: 23 mmol/L (ref 22–32)
Calcium: 7.3 mg/dL — ABNORMAL LOW (ref 8.9–10.3)
Chloride: 106 mmol/L (ref 98–111)
Creatinine, Ser: 0.95 mg/dL (ref 0.44–1.00)
GFR, Estimated: >60 mL/min (ref >60)
Glucose, Bld: 259 mg/dL — ABNORMAL HIGH (ref 70–99)
Potassium: 3.4 mmol/L — ABNORMAL LOW (ref 3.5–5.1)
Sodium: 132 mmol/L — ABNORMAL LOW (ref 135–145)
Total Bilirubin: 0.4 mg/dL (ref 0.3–1.2)
Total Protein: 4.8 g/dL — ABNORMAL LOW (ref 6.5–8.1)

## 2021-06-30 LAB — URINE CULTURE: Culture: <10000 — AB

## 2021-06-30 LAB — CBC
HCT: 31.7 % — ABNORMAL LOW (ref 36.0–46.0)
Hemoglobin: 10.5 g/dL — ABNORMAL LOW (ref 12.0–15.0)
MCH: 27.3 pg (ref 26.0–34.0)
MCHC: 33.1 g/dL (ref 30.0–36.0)
MCV: 82.3 fL (ref 80.0–100.0)
Platelets: 88 10*3/uL — ABNORMAL LOW (ref 150–400)
RBC: 3.85 MIL/uL — ABNORMAL LOW (ref 3.87–5.11)
RDW: 12.9 % (ref 11.5–15.5)
WBC: 5 10*3/uL (ref 4.0–10.5)
nRBC: 0 % (ref 0.0–0.2)

## 2021-06-30 LAB — GLUCOSE, CAPILLARY
Glucose-Capillary: 119 mg/dL — ABNORMAL HIGH (ref 70–99)
Glucose-Capillary: 148 mg/dL — ABNORMAL HIGH (ref 70–99)
Glucose-Capillary: 151 mg/dL — ABNORMAL HIGH (ref 70–99)
Glucose-Capillary: 151 mg/dL — ABNORMAL HIGH (ref 70–99)
Glucose-Capillary: 154 mg/dL — ABNORMAL HIGH (ref 70–99)
Glucose-Capillary: 179 mg/dL — ABNORMAL HIGH (ref 70–99)

## 2021-06-30 LAB — D-DIMER, QUANTITATIVE: D-Dimer, Quant: 1.8 ug/mL-FEU — ABNORMAL HIGH (ref 0.00–0.50)

## 2021-06-30 LAB — MRSA NEXT GEN BY PCR, NASAL: MRSA by PCR Next Gen: NOT DETECTED

## 2021-06-30 LAB — MAGNESIUM: Magnesium: 1.6 mg/dL — ABNORMAL LOW (ref 1.7–2.4)

## 2021-06-30 LAB — LACTIC ACID, PLASMA: Lactic Acid, Venous: 1.5 mmol/L (ref 0.5–1.9)

## 2021-06-30 LAB — C-REACTIVE PROTEIN: CRP: 2.9 mg/dL — ABNORMAL HIGH (ref <1.0)

## 2021-06-30 MED ORDER — MAGNESIUM SULFATE 4 GM/100ML IV SOLN
4.0000 g | Freq: Once | INTRAVENOUS | Status: AC
  Administered 2021-06-30: 13:00:00 4 g via INTRAVENOUS
  Filled 2021-06-30: qty 100

## 2021-06-30 MED ORDER — POTASSIUM CHLORIDE CRYS ER 20 MEQ PO TBCR
40.0000 meq | EXTENDED_RELEASE_TABLET | Freq: Once | ORAL | Status: AC
  Administered 2021-06-30: 06:00:00 40 meq via ORAL
  Filled 2021-06-30: qty 2

## 2021-06-30 MED ORDER — ALBUTEROL SULFATE HFA 108 (90 BASE) MCG/ACT IN AERS
2.0000 | INHALATION_SPRAY | Freq: Two times a day (BID) | RESPIRATORY_TRACT | Status: DC
  Filled 2021-06-30: qty 6.7

## 2021-06-30 MED ORDER — MIDODRINE HCL 5 MG PO TABS
10.0000 mg | ORAL_TABLET | Freq: Three times a day (TID) | ORAL | Status: DC
  Administered 2021-06-30 – 2021-07-05 (×15): 10 mg via ORAL
  Filled 2021-06-30 (×15): qty 2

## 2021-06-30 MED ORDER — LEVOTHYROXINE SODIUM 50 MCG PO TABS
50.0000 ug | ORAL_TABLET | Freq: Every day | ORAL | Status: DC
  Administered 2021-07-01 – 2021-07-05 (×5): 50 ug via ORAL
  Filled 2021-06-30 (×5): qty 1

## 2021-06-30 MED ORDER — POTASSIUM CHLORIDE CRYS ER 20 MEQ PO TBCR
40.0000 meq | EXTENDED_RELEASE_TABLET | Freq: Two times a day (BID) | ORAL | Status: AC
  Administered 2021-06-30 (×2): 40 meq via ORAL
  Filled 2021-06-30 (×2): qty 2

## 2021-06-30 MED ORDER — MAGNESIUM SULFATE 4 GM/100ML IV SOLN
4.0000 g | Freq: Once | INTRAVENOUS | Status: AC
  Administered 2021-06-30: 08:00:00 4 g via INTRAVENOUS
  Filled 2021-06-30: qty 100

## 2021-06-30 MED ORDER — CALCIUM GLUCONATE-NACL 1-0.675 GM/50ML-% IV SOLN
1.0000 g | Freq: Once | INTRAVENOUS | Status: AC
  Administered 2021-06-30: 06:00:00 1000 mg via INTRAVENOUS
  Filled 2021-06-30: qty 50

## 2021-06-30 MED ORDER — CHLORHEXIDINE GLUCONATE CLOTH 2 % EX PADS
6.0000 | MEDICATED_PAD | Freq: Every day | CUTANEOUS | Status: DC
  Administered 2021-06-30 – 2021-07-03 (×4): 6 via TOPICAL

## 2021-06-30 MED ORDER — CALCIUM GLUCONATE-NACL 1-0.675 GM/50ML-% IV SOLN
1.0000 g | Freq: Once | INTRAVENOUS | Status: AC
  Administered 2021-06-30: 12:00:00 1000 mg via INTRAVENOUS
  Filled 2021-06-30: qty 50

## 2021-06-30 NOTE — Progress Notes (Signed)
[facility] ADULT ICU REPLACEMENT PROTOCOL   The patient does apply for the [facility] Adult ICU Electrolyte Replacment Protocol based on the criteria listed below:   1.Exclusion criteria: TCTS patients, ECMO patients, and Dialysis patients 2. Is GFR >/= 30 ml/min? Yes.    Patient's GFR today is >60 3. Is SCr </= 2? Yes.   Patient's SCr is 0.95 mg/dL 4. Did SCr increase >/= 0.5 in 24 hours? No. 5.Pt's weight >40kg  Yes.   6. Abnormal electrolyte(s):  K 3.4, Mg 1.6  7. Electrolytes replaced per protocol 8.  Call MD STAT for K+ </= 2.5, Phos </= 1, or Mag </= 1 Physician:  S. Bobbye Morton R Harleyquinn Gasser 06/30/2021 4:45 AM

## 2021-06-30 NOTE — Progress Notes (Signed)
eLink Physician-Brief Progress Note Patient Name: Heather Poole DOB: 1971-04-09 MRN: [id number]   Date of Service  06/30/2021  HPI/Events of Note  Hypocalcemia - Ca++ = 7.3 which corrects to 8.66 (Low) given albumin = 2.3.  eICU Interventions  Will replace Ca++.     Intervention Category Major Interventions: Electrolyte abnormality - evaluation and management  Jonaya Freshour [place] 06/30/2021, 4:59 AM

## 2021-06-30 NOTE — Progress Notes (Signed)
Patient to be moved to the unit for low-dose Levophed given persistent hypotension

## 2021-06-30 NOTE — Consult Note (Signed)
NAME:  Heather Poole, MRN:  [id number], DOB:  March 11, 1971, LOS: 1 ADMISSION DATE:  06/28/2021, CONSULTATION DATE: 06/30/2019 REFERRING MD: Triad, CHIEF COMPLAINT: Refractory hypotension  History of Present Illness:  -year-old female with a history of UTIs who presents with left lower abdominal pain and painful urination without fevers chills sweats or productive cough.  She is a COVID positive is an incidental finding..  Urine is dirty cloudy mucus and bacteria.  She was started on antimicrobial therapy and fluid resuscitation was a slightly elevated procalcitonin and normal lactic acid.  Pulmonary critical care was called for refractory hypotension she started on the fourth liter of IV fluids blood pressure is now 100/56 with a heart rate of 74.  She can be admitted to the stepdown unit for close observation.  There may be a need for peripheral vasopressors in the future.  Pulmonary critical care will be available as needed.  Pertinent  Medical History  Positive for history of urinary tract infection with ESBL Lower extremity edema for which she takes Demadex and had a 2D echo performed in 2009 with trivial area   Significant Hospital Events: Including procedures, antibiotic start and stop dates in addition to other pertinent events   06/29/2021 antimicrobial therapy started 06/29/2021 fluid resuscitation 06/29/2021 PCCM consult  Interim History / Subjective:  Patient blood pressure dropped again, requiring IV vasopressors infusion, was transferred to ICU for treatment of septic shock Spiked fever with T-max 103.1   Objective   Blood pressure (!) 108/56, pulse 81, temperature 98.8 F (37.1 C), temperature source Axillary, resp. rate (!) 21, height 5' (1.524 m), weight 63.6 kg, last menstrual period 02/02/2017, SpO2 99 %.        Intake/Output Summary (Last 24 hours) at 06/30/2021 1218 Last data filed at 06/30/2021 1141 Gross per 24 hour  Intake 4687.67 ml  Output 650 ml  Net 4037.67 ml    Filed Weights   06/29/21 0139 06/30/21 0000  Weight: 59.4 kg 63.6 kg    Examination: General: Acutely ill-appearing female, lying on the bed HEENT: [place]/AT, eyes anicteric.  moist mucus membranes Neuro: Alert, awake following commands Chest: Coarse breath sounds, no wheezes or rhonchi Heart: Regular rate and rhythm, no murmurs or gallops Abdomen: Soft, nontender, nondistended, bowel sounds present Skin: No rash  Resolved Hospital Problem list     Assessment & Plan:  Severe sepsis with septic shock due to recurrent UTI with prior history of ESBL  COVID infection, asymptomatic  Procalcitonin is elevated UA suggestive of UTI Urine culture is in process Continue IV meropenem for now Adjust antibiotic once cultures and sensitivities is complex Continue IV fluid resuscitation Continue IV Levophed with map goal 65 Continue contact and airborne isolation Continue Paxlovid   History of hypothyroidism TSH is 0.5  Hyponatremia/hypokalemia/hypomagnesemia Continue aggressive electrolyte supplement and monitor  Best Practice (right click and "Reselect all SmartList Selections" daily)   Diet/type: Regular consistency (see orders) DVT prophylaxis: LMWH GI prophylaxis: PPI Lines: N/A Foley:  N/A Code Status:  full code Last date of multidisciplinary goals of care discussion [12/21: Patient, her son and her sister was updated at bedside.]  Labs   CBC: Recent Labs  Lab 06/29/21 0003 06/29/21 0502 06/30/21 0336  WBC 7.5 5.2 5.0  NEUTROABS 6.4 4.2  --   HGB 11.3* 10.2* 10.5*  HCT 33.6* 31.3* 31.7*  MCV 83.2 85.1 82.3  PLT 120* 91* 88*    Basic Metabolic Panel: Recent Labs  Lab 06/29/21 0003 06/29/21 0502 06/30/21 0336  NA 134* 134* 132*  K 2.8* 3.1* 3.4*  CL 103 104 106  CO2 25 23 23   GLUCOSE 215* 229* 259*  BUN 13 11 5*  CREATININE 1.06* 0.93 0.95  CALCIUM 8.0* 7.6* 7.3*  MG  --  1.4* 1.6*   GFR: Estimated Creatinine Clearance: 58.9 mL/min (by C-G formula  based on SCr of 0.95 mg/dL). Recent Labs  Lab 06/29/21 0003 06/29/21 0502 06/29/21 0913 06/29/21 1846 06/29/21 2158 06/30/21 0336  PROCALCITON  --  28.23  --   --   --   --   WBC 7.5 5.2  --   --   --  5.0  LATICACIDVEN 1.3  --  1.6 2.7* 0.9 1.5    Liver Function Tests: Recent Labs  Lab 06/29/21 0003 06/29/21 0502 06/30/21 0336  AST 35 71* 50*  ALT 20 41 40  ALKPHOS 72 73 72  BILITOT 0.7 0.8 0.4  PROT 6.5 4.8* 4.8*  ALBUMIN 3.2* 2.5* 2.3*   No results for input(s): LIPASE, AMYLASE in the last 168 hours. No results for input(s): AMMONIA in the last 168 hours.  ABG    Component Value Date/Time   TCO2 23 02/22/2008 1947     Coagulation Profile: Recent Labs  Lab 06/29/21 0012  INR 1.1    Cardiac Enzymes: No results for input(s): CKTOTAL, CKMB, CKMBINDEX, TROPONINI in the last 168 hours.  HbA1C: Hgb A1c MFr Bld  Date/Time Value Ref Range Status  06/29/2021 05:02 AM 6.2 (H) 4.8 - 5.6 % Final    Comment:    (NOTE) Pre diabetes:          5.7%-6.4%  Diabetes:              >6.4%  Glycemic control for   <7.0% adults with diabetes     CBG: Recent Labs  Lab 06/29/21 1757 06/29/21 2120 06/30/21 0036 06/30/21 0707 06/30/21 1153  GLUCAP 116* 132* 179* 151* 119*    Critical care time:     Total critical care time: 33 minutes  Performed by: 07/02/21   Critical care time was exclusive of separately billable procedures and treating other patients.   Critical care was necessary to treat or prevent imminent or life-threatening deterioration.   Critical care was time spent personally by me on the following activities: development of treatment plan with patient and/or surrogate as well as nursing, discussions with consultants, evaluation of patient's response to treatment, examination of patient, obtaining history from patient or surrogate, ordering and performing treatments and interventions, ordering and review of laboratory studies, ordering and  review of radiographic studies, pulse oximetry and re-evaluation of patient's condition.   Cheri Fowler MD [facility] See Amion for pager If no response to pager, please call [phone] until 7pm After 7pm, Please call E-link [phone]

## 2021-06-30 NOTE — Progress Notes (Signed)
Patient now down to of levophed. Remains alert and oriented x4. On room air. VSS. Family remains at bedside. Will continue to monitor.

## 2021-07-01 LAB — BASIC METABOLIC PANEL
Anion gap: 5 (ref 5–15)
BUN: <5 mg/dL — ABNORMAL LOW (ref 6–20)
CO2: 21 mmol/L — ABNORMAL LOW (ref 22–32)
Calcium: 7.4 mg/dL — ABNORMAL LOW (ref 8.9–10.3)
Chloride: 111 mmol/L (ref 98–111)
Creatinine, Ser: 0.82 mg/dL (ref 0.44–1.00)
GFR, Estimated: >60 mL/min (ref >60)
Glucose, Bld: 71 mg/dL (ref 70–99)
Potassium: 5.5 mmol/L — ABNORMAL HIGH (ref 3.5–5.1)
Sodium: 137 mmol/L (ref 135–145)

## 2021-07-01 LAB — GLUCOSE, CAPILLARY
Glucose-Capillary: 87 mg/dL (ref 70–99)
Glucose-Capillary: 95 mg/dL (ref 70–99)
Glucose-Capillary: 127 mg/dL — ABNORMAL HIGH (ref 70–99)
Glucose-Capillary: 124 mg/dL — ABNORMAL HIGH (ref 70–99)
Glucose-Capillary: 106 mg/dL — ABNORMAL HIGH (ref 70–99)

## 2021-07-01 LAB — MAGNESIUM: Magnesium: 2.4 mg/dL (ref 1.7–2.4)

## 2021-07-01 LAB — PHOSPHORUS: Phosphorus: 2.2 mg/dL — ABNORMAL LOW (ref 2.5–4.6)

## 2021-07-01 MED ORDER — ALBUTEROL SULFATE HFA 108 (90 BASE) MCG/ACT IN AERS
2.0000 | INHALATION_SPRAY | Freq: Four times a day (QID) | RESPIRATORY_TRACT | Status: DC | PRN
  Filled 2021-07-01: qty 6.7

## 2021-07-01 MED ORDER — SODIUM PHOSPHATES 45 MMOLE/15ML IV SOLN
15.0000 mmol | Freq: Once | INTRAVENOUS | Status: AC
  Administered 2021-07-01: 11:00:00 15 mmol via INTRAVENOUS
  Filled 2021-07-01: qty 5

## 2021-07-01 NOTE — Progress Notes (Signed)
NAME:  Heather Poole, MRN:  [id number], DOB:  09/20/1970, LOS: 2 ADMISSION DATE:  06/28/2021, CONSULTATION DATE: 06/30/2019 REFERRING MD: Triad, CHIEF COMPLAINT: Refractory hypotension  History of Present Illness:  50 year old female with a history of UTIs who presents with left lower abdominal pain and painful urination without fevers chills sweats or productive cough.  She is a COVID positive is an incidental finding..  Urine is dirty cloudy mucus and bacteria.  She was started on antimicrobial therapy and fluid resuscitation was a slightly elevated procalcitonin and normal lactic acid.  Pulmonary critical care was called for refractory hypotension she started on the fourth liter of IV fluids blood pressure is now 100/56 with a heart rate of 74.  She can be admitted to the stepdown unit for close observation.  There may be a need for peripheral vasopressors in the future.  Pulmonary critical care will be available as needed.  Pertinent  Medical History  Positive for history of urinary tract infection with ESBL Lower extremity edema for which she takes Demadex and had a 2D echo performed in 2009 with trivial area   Significant Hospital Events: Including procedures, antibiotic start and stop dates in addition to other pertinent events   06/29/2021 abx (flagyl and cefepime) 06/29/2021 fluid resuscitation, abx changed to meropenem, paxlovid started for 3days  06/29/2021 PCCM consult 12/21 transferred to ICU for worsening hypotension, pressors started, midodrine added, tmax 103.1  Interim History / Subjective:  Off NE since 6am Patient without complaints  Objective   Blood pressure (!) 114/59, pulse 63, temperature 98.8 F (37.1 C), temperature source Oral, resp. rate 18, height 5' (1.524 m), weight 63.6 kg, last menstrual period 02/02/2017, SpO2 99 %.        Intake/Output Summary (Last 24 hours) at 07/01/2021 1111 Last data filed at 07/01/2021 1000 Gross per 24 hour  Intake 3362.63 ml   Output 2100 ml  Net 1262.63 ml   Filed Weights   06/29/21 0139 06/30/21 0000  Weight: 59.4 kg 63.6 kg    Examination: General:  Pleasant, thin adult female sitting upright in bed in NAD HEENT: MM pink/moist Neuro:  Alert, oriented x 3, MAE CV: NSR, 2 +dp PULM:  non labored, clear and diminished, remains on room air GI: soft, bs+ Extremities: warm/dry, no edema  Skin: no rashes   Good UOP 1.8L /24hrs plus unmeasured urinary occurrences sCr remains wnl  K 5.5, phos 2.2 No CBC today    Resolved Hospital Problem list     Assessment & Plan:  Severe sepsis with septic shock due to recurrent UTI with prior history of ESBL  COVID infection, asymptomatic  - UC (after abx) with 10K colonies/ insignificant growth - continue meropenem for 5 days of therapy - off pressors, hemodynamically stable for transfer out of ICU  - continue midodrine 10mg  TID, wean as able  - paxlovid for 3 days of therapy, continue vit C and zinc - ongoing aggressive pulmonary hygiene- IS/ flutter/ mobilize  - continue contact/ airborne precautions  History of hypothyroidism TSH is 0.5 - continue home levothyroxine 50 mcg daily   Hyponatremia/hypokalemia/hypomagnesemia/ hypophos - sodium phos replete today - repeat renal panel in am   DMT2 - continue SSI AC/ HS moderate  Thrombocytopenia  - likely secondary to sepsis, recheck CBC in am   Best Practice (right click and "Reselect all SmartList Selections" daily)   Diet/type: Regular consistency (see orders)- carb modified DVT prophylaxis: LMWH GI prophylaxis: PPI Lines: N/A Foley:  N/A Code Status:  full code Last date of multidisciplinary goals of care discussion [12/21:  Patient updated on plan of care.  Son and sister at bedside also.  She is hemodynamically stable and ready for transfer out of ICU.  Will transfer back to [facility] as of 12/23 and PCCM will be available as needed.    Labs   CBC: Recent Labs  Lab 06/29/21 0003  06/29/21 0502 06/30/21 0336  WBC 7.5 5.2 5.0  NEUTROABS 6.4 4.2  --   HGB 11.3* 10.2* 10.5*  HCT 33.6* 31.3* 31.7*  MCV 83.2 85.1 82.3  PLT 120* 91* 88*    Basic Metabolic Panel: Recent Labs  Lab 06/29/21 0003 06/29/21 0502 06/30/21 0336 07/01/21 0313  NA 134* 134* 132* 137  K 2.8* 3.1* 3.4* 5.5*  CL 103 104 106 111  CO2 25 23 23  21*  GLUCOSE 215* 229* 259* 71  BUN 13 11 5* <5*  CREATININE 1.06* 0.93 0.95 0.82  CALCIUM 8.0* 7.6* 7.3* 7.4*  MG  --  1.4* 1.6* 2.4  PHOS  --   --   --  2.2*   GFR: Estimated Creatinine Clearance: 68.3 mL/min (by C-G formula based on SCr of 0.82 mg/dL). Recent Labs  Lab 06/29/21 0003 06/29/21 0502 06/29/21 0913 06/29/21 1846 06/29/21 2158 06/30/21 0336  PROCALCITON  --  28.23  --   --   --   --   WBC 7.5 5.2  --   --   --  5.0  LATICACIDVEN 1.3  --  1.6 2.7* 0.9 1.5    Liver Function Tests: Recent Labs  Lab 06/29/21 0003 06/29/21 0502 06/30/21 0336  AST 35 71* 50*  ALT 20 41 40  ALKPHOS 72 73 72  BILITOT 0.7 0.8 0.4  PROT 6.5 4.8* 4.8*  ALBUMIN 3.2* 2.5* 2.3*   No results for input(s): LIPASE, AMYLASE in the last 168 hours. No results for input(s): AMMONIA in the last 168 hours.  ABG    Component Value Date/Time   TCO2 23 02/22/2008 1947     Coagulation Profile: Recent Labs  Lab 06/29/21 0012  INR 1.1    Cardiac Enzymes: No results for input(s): CKTOTAL, CKMB, CKMBINDEX, TROPONINI in the last 168 hours.  HbA1C: Hgb A1c MFr Bld  Date/Time Value Ref Range Status  06/29/2021 05:02 AM 6.2 (H) 4.8 - 5.6 % Final    Comment:    (NOTE) Pre diabetes:          5.7%-6.4%  Diabetes:              >6.4%  Glycemic control for   <7.0% adults with diabetes     CBG: Recent Labs  Lab 06/30/21 1153 06/30/21 1537 06/30/21 1704 06/30/21 2006 07/01/21 0708  GLUCAP 119* 148* 154* 151* 87    Critical care time:  n/a     07/03/21, ACNP [facility] 07/01/2021, 11:11 AM  See Amion  for pager If no response to pager, please call PCCM consult pager After 7:00 pm call Elink

## 2021-07-02 ENCOUNTER — Inpatient Hospital Stay (HOSPITAL_COMMUNITY): Payer: 59

## 2021-07-02 DIAGNOSIS — A419 Sepsis, unspecified organism: Secondary | ICD-10-CM | POA: Diagnosis not present

## 2021-07-02 DIAGNOSIS — N390 Urinary tract infection, site not specified: Secondary | ICD-10-CM | POA: Diagnosis not present

## 2021-07-02 LAB — GLUCOSE, CAPILLARY
Glucose-Capillary: 122 mg/dL — ABNORMAL HIGH (ref 70–99)
Glucose-Capillary: 110 mg/dL — ABNORMAL HIGH (ref 70–99)
Glucose-Capillary: 118 mg/dL — ABNORMAL HIGH (ref 70–99)

## 2021-07-02 LAB — RENAL FUNCTION PANEL
Albumin: 2.3 g/dL — ABNORMAL LOW (ref 3.5–5.0)
Anion gap: 5 (ref 5–15)
BUN: 6 mg/dL (ref 6–20)
CO2: 26 mmol/L (ref 22–32)
Calcium: 7.8 mg/dL — ABNORMAL LOW (ref 8.9–10.3)
Chloride: 108 mmol/L (ref 98–111)
Creatinine, Ser: 0.89 mg/dL (ref 0.44–1.00)
GFR, Estimated: >60 mL/min (ref >60)
Glucose, Bld: 102 mg/dL — ABNORMAL HIGH (ref 70–99)
Phosphorus: 3.3 mg/dL (ref 2.5–4.6)
Potassium: 4.4 mmol/L (ref 3.5–5.1)
Sodium: 139 mmol/L (ref 135–145)

## 2021-07-02 LAB — PROCALCITONIN: Procalcitonin: 7.01 ng/mL

## 2021-07-02 LAB — CBC
HCT: 32.9 % — ABNORMAL LOW (ref 36.0–46.0)
Hemoglobin: 10.6 g/dL — ABNORMAL LOW (ref 12.0–15.0)
MCH: 27.1 pg (ref 26.0–34.0)
MCHC: 32.2 g/dL (ref 30.0–36.0)
MCV: 84.1 fL (ref 80.0–100.0)
Platelets: 118 10*3/uL — ABNORMAL LOW (ref 150–400)
RBC: 3.91 MIL/uL (ref 3.87–5.11)
RDW: 13.5 % (ref 11.5–15.5)
WBC: 3.2 10*3/uL — ABNORMAL LOW (ref 4.0–10.5)
nRBC: 0 % (ref 0.0–0.2)

## 2021-07-02 LAB — D-DIMER, QUANTITATIVE: D-Dimer, Quant: 0.87 ug/mL-FEU — ABNORMAL HIGH (ref 0.00–0.50)

## 2021-07-02 LAB — TSH: TSH: 4.857 u[IU]/mL — ABNORMAL HIGH (ref 0.350–4.500)

## 2021-07-02 LAB — C-REACTIVE PROTEIN: CRP: 0.6 mg/dL (ref <1.0)

## 2021-07-02 LAB — BRAIN NATRIURETIC PEPTIDE: B Natriuretic Peptide: 86.7 pg/mL (ref 0.0–100.0)

## 2021-07-02 LAB — CORTISOL: Cortisol, Plasma: 11.6 ug/dL

## 2021-07-02 LAB — MAGNESIUM: Magnesium: 2.1 mg/dL (ref 1.7–2.4)

## 2021-07-02 MED ORDER — VITAMIN D (ERGOCALCIFEROL) 1.25 MG (50000 UNIT) PO CAPS
50000.0000 [IU] | ORAL_CAPSULE | ORAL | Status: DC
Start: 2021-07-04 — End: 2021-07-05
  Administered 2021-07-04: 08:00:00 50000 [IU] via ORAL
  Filled 2021-07-02: qty 1

## 2021-07-02 MED ORDER — LACTATED RINGERS IV BOLUS
1000.0000 mL | Freq: Once | INTRAVENOUS | Status: AC
  Administered 2021-07-02: 12:00:00 1000 mL via INTRAVENOUS

## 2021-07-02 NOTE — Progress Notes (Signed)
RN + patient family at bedside. Patient arrived from ICU and appears distress free with no complaints of pain. Patient assessed. Patient appears to have generalized edema. LR running at . Additional ICU access observed but not documented in LDA. Consulting civil engineer notified. RN from ICU department to remove unrecognized access. Tele-monitoring connections attached per policy. Patient oriented to unit. Will continue to monitor.

## 2021-07-02 NOTE — Progress Notes (Signed)
RN + family at bedside. BP 78/64 HR 70. Unable to administer Merrem 1G; Pharmacy department notified. Per pharmacy; medication to be tubed up before the end of shift. At this time, medication not available on unit; no dosage received. Pharmacy notified again before end of shift. Report given to oncoming RN; Will continue to monitor

## 2021-07-02 NOTE — Evaluation (Signed)
Physical Therapy Evaluation Patient Details Name: Heather Poole MRN: [id number] DOB: 12/07/1970 Today's Date: 07/02/2021  History of Present Illness  Pt is a 50 y/o female admitted 06/29/21 with 2 days of malaise and dysuria. Found with sepsis due to UTI. 12/21 transfer to ICU from hypotension; off pressors 12/22. COVID +. Chest xray 12/23 L lower lobe PNA. No PMH on file.  Clinical Impression  Pt is close to baseline functioning and should be safe at home with family assist. There are no further acute PT needs.  Will sign off at this time.        Recommendations for follow up therapy are one component of a multi-disciplinary discharge planning process, led by the attending physician.  Recommendations may be updated based on patient status, additional functional criteria and insurance authorization.  Follow Up Recommendations No PT follow up    Assistance Recommended at Discharge Intermittent Supervision/Assistance  Functional Status Assessment Patient has had a recent decline in their functional status and demonstrates the ability to make significant improvements in function in a reasonable and predictable amount of time.  Equipment Recommendations  None recommended by PT    Recommendations for Other Services       Precautions / Restrictions Precautions Precautions: Fall      Mobility  Bed Mobility Overal bed mobility: Modified Independent             General bed mobility comments: HOB elevated, no assist required    Transfers Overall transfer level: Modified independent                      Ambulation/Gait Ambulation/Gait assistance: Modified independent (Device/Increase time);Independent Gait Distance (Feet): 100 Feet Assistive device: IV Pole;None Gait Pattern/deviations: Step-through pattern   Gait velocity interpretation: 1.31 - 2.62 ft/sec, indicative of limited community ambulator   General Gait Details: steady and able to adjust speed as needed.   VSS, No dyspnea, no sign of fatigue.  Stairs            Wheelchair Mobility    Modified Rankin (Stroke Patients Only)       Balance Overall balance assessment: No apparent balance deficits (not formally assessed)                                           Pertinent Vitals/Pain Pain Assessment: Faces Faces Pain Scale: No hurt Pain Intervention(s): Monitored during session    Home Living Family/patient expects to be discharged to:: Private residence Living Arrangements: Children (nephew) Available Help at Discharge: Family;Available 24 hours/day Type of Home: House         Home Layout: One level Home Equipment: None      Prior Function Prior Level of Function : Independent/Modified Independent               ADLs Comments: independent, drives, part time nail tech     Hand Dominance   Dominant Hand: Right    Extremity/Trunk Assessment   Upper Extremity Assessment Upper Extremity Assessment: Overall WFL for tasks assessed    Lower Extremity Assessment Lower Extremity Assessment: Overall WFL for tasks assessed (mild weakness)    Cervical / Trunk Assessment Cervical / Trunk Assessment: Normal  Communication   Communication: Prefers language other than English (able to understand english- but not first language)  Cognition Arousal/Alertness: Awake/alert Behavior During Therapy: WFL for tasks assessed/performed Overall Cognitive Status:  Within Functional Limits for tasks assessed                                 General Comments: appears WFL, some difficulty understanding but antiipcate due to language barrier.  Pt reports feeling back to baseline.        General Comments General comments (skin integrity, edema, etc.): vss    Exercises     Assessment/Plan    PT Assessment Patient does not need any further PT services  PT Problem List         PT Treatment Interventions      PT Goals (Current goals can be  found in the Care Plan section)  Acute Rehab PT Goals PT Goal Formulation: All assessment and education complete, DC therapy    Frequency     Barriers to discharge        Co-evaluation               AM-PAC PT "6 Clicks" Mobility  Outcome Measure Help needed turning from your back to your side while in a flat bed without using bedrails?: None Help needed moving from lying on your back to sitting on the side of a flat bed without using bedrails?: None Help needed moving to and from a bed to a chair (including a wheelchair)?: None Help needed standing up from a chair using your arms (e.g., wheelchair or bedside chair)?: None Help needed to walk in hospital room?: None Help needed climbing 3-5 steps with a railing? : A Little 6 Click Score: 23    End of Session   Activity Tolerance: Patient tolerated treatment well Patient left: in bed;with call bell/phone within reach Nurse Communication: Mobility status      Time: 2951-8841 PT Time Calculation (min) (ACUTE ONLY): 21 min   Charges:   PT Evaluation $PT Eval Low Complexity: 1 Low          07/02/2021  Jacinto Halim., PT Acute Rehabilitation Services [phone]  (pager) [phone]  (office)  Eliseo Gum Maja Mccaffery 07/02/2021, 5:47 PM

## 2021-07-02 NOTE — Progress Notes (Signed)
PROGRESS NOTE                                                                                                                                                                                                             Patient Demographics:    Heather Poole, is a 50 y.o. female, DOB - 10-Sep-1970, LTJ:[id number]  Outpatient Primary MD for the patient is Patient, No Pcp Per (Inactive)    LOS - 3  Admit date - 06/28/2021    Chief Complaint  Patient presents with   Weakness       Brief Narrative (HPI from H&P)   50 yr old female with a history of UTIs, hypothyroidism, who presents with left lower abdominal pain and painful urination without fevers chills sweats or productive cough.  She is a COVID positive is an incidental finding, she was admitted for sepsis to ICU now stabilized transferred to [facility] on 07/02/2021 on day 3 of her hospital stay.   Subjective:    Heather Poole today has, No headache, No chest pain, No abdominal pain - No Nausea, No new weakness tingling or numbness, no SOB.   Assessment  & Plan :     Sepsis due to UTI.  History of recurrent UTIs and ESBL.  Currently on IV meropenem complete 5-day treatment, cultures so far negative sepsis pathophysiology has resolved.   2.  Hypothyroidism.  On Synthroid.  3.  Persistent hypotension.  Sepsis pathophysiology has resolved, check TSH, cortisol, and a liter of IV fluid, states she has been adequately hydrated so far.  Continue midodrine and monitor.      Condition - Fair  Family Communication  :  family bedside 07/02/21  Code Status :  Full  Consults  :  PCCM  PUD Prophylaxis :     Procedures  :     CT - 1. No bowel obstruction or free air. The appendix is not definitely visualized on exam. If there is clinical concern for appendicitis, repeat evaluation with oral contrast is suggested. 2. Mild gastric wall thickening, possible gastritis. 3. Trace amount of free  fluid in the right lower quadrant.   TTE - 1. Left ventricular ejection fraction, by estimation, is 65 to 70%. The left ventricle has normal function. The left ventricle has no regional wall motion abnormalities. Left ventricular diastolic parameters were normal.  2. Right ventricular systolic function is normal. The right ventricular size is normal. There is normal pulmonary artery systolic pressure.  3. The mitral valve is normal in structure. Trivial mitral valve regurgitation. No evidence of mitral stenosis.  4. The aortic valve is grossly normal. Aortic valve regurgitation is not visualized. No aortic stenosis is present.  5. The inferior vena cava is normal in size with greater than 50% respiratory variability, suggesting right atrial pressure of 3 mmHg.       Disposition Plan  :    Status is: Inpatient  Remains inpatient appropriate because: Sepsis  DVT Prophylaxis  :    enoxaparin (LOVENOX) injection 40 mg Start: 06/29/21 1000  Lab Results  Component Value Date   PLT 118 (L) 07/02/2021    Diet :  Diet Order             Diet Carb Modified Fluid consistency: Thin; Room service appropriate? Yes  Diet effective now                    Inpatient Medications  Scheduled Meds:  vitamin C  500 mg Oral Daily   Chlorhexidine Gluconate Cloth  6 each Topical Daily   enoxaparin (LOVENOX) injection  40 mg Subcutaneous Daily   insulin aspart  0-15 Units Subcutaneous TID AC & HS   levothyroxine  50 mcg Oral Q0600   midodrine  10 mg Oral TID WC   zinc sulfate  220 mg Oral Daily   Continuous Infusions:  sodium chloride Stopped (07/01/21 0949)   meropenem (MERREM) IV 1 g (07/02/21 0914)   PRN Meds:.acetaminophen **OR** acetaminophen, albuterol, guaiFENesin-dextromethorphan, ondansetron **OR** ondansetron (ZOFRAN) IV, polyethylene glycol  Antibiotics  :    Anti-infectives (From admission, onward)    Start     Dose/Rate Route Frequency Ordered Stop   06/29/21 2200  vancomycin  (VANCOREADY) IVPB 750 mg/150 mL  Status:  Discontinued        750 mg 150 mL/hr over 60 Minutes Intravenous Every 24 hours 06/29/21 0144 06/29/21 0413   06/29/21 1945  meropenem (MERREM) 1 g in sodium chloride 0.9 % 100 mL IVPB        1 g 200 mL/hr over 30 Minutes Intravenous Every 8 hours 06/29/21 1849 07/04/21 1759   06/29/21 1000  ceFEPIme (MAXIPIME) 2 g in sodium chloride 0.9 % 100 mL IVPB  Status:  Discontinued        2 g 200 mL/hr over 30 Minutes Intravenous Every 12 hours 06/29/21 0142 06/29/21 0733   06/29/21 1000  meropenem (MERREM) 1 g in sodium chloride 0.9 % 100 mL IVPB  Status:  Discontinued        1 g 200 mL/hr over 30 Minutes Intravenous Every 12 hours 06/29/21 0858 06/29/21 1849   06/29/21 0700  nirmatrelvir/ritonavir EUA (PAXLOVID) 3 tablet        3 tablet Oral 2 times daily 06/29/21 0513 07/01/21 2133   06/29/21 0500  metroNIDAZOLE (FLAGYL) IVPB 500 mg  Status:  Discontinued        500 mg 100 mL/hr over 60 Minutes Intravenous 2 times daily 06/29/21 0412 06/29/21 0913   06/29/21 0015  ceFEPIme (MAXIPIME) 2 g in sodium chloride 0.9 % 100 mL IVPB        2 g 200 mL/hr over 30 Minutes Intravenous  Once 06/29/21 0013 06/29/21 0213   06/29/21 0015  metroNIDAZOLE (FLAGYL) IVPB 500 mg  Status:  Discontinued        500 mg  100 mL/hr over 60 Minutes Intravenous  Once 06/29/21 0013 06/29/21 0205   06/29/21 0015  vancomycin (VANCOCIN) IVPB 1000 mg/200 mL premix  Status:  Discontinued        1,000 mg 200 mL/hr over 60 Minutes Intravenous  Once 06/29/21 0013 06/29/21 0206        Time Spent in minutes  30   Susa Raring M.D on 07/02/2021 at 10:27 AM  To page go to [email or web address]   [facility] -  Office  [phone]  See all Orders from today for further details    Objective:   Vitals:   07/02/21 0400 07/02/21 0430 07/02/21 0530 07/02/21 0600  BP: 106/67 108/68 118/61 (!) 98/45  Pulse: (!) 58 (!) 57 (!) 57 (!) 55  Resp: 18 (!) Temp:      TempSrc:       SpO2: 95% 98% 97% 97%  Weight:      Height:        Wt Readings from Last 3 Encounters:  06/30/21 63.6 kg     Intake/Output Summary (Last 24 hours) at 07/02/2021 1027 Last data filed at 07/02/2021 0200 Gross per 24 hour  Intake 531.98 ml  Output 625 ml  Net -93.02 ml     Physical Exam  Awake Alert, No new F.N deficits, Normal affect [place].AT,PERRAL Supple Neck, No JVD,   Symmetrical Chest wall movement, Good air movement bilaterally, CTAB RRR,No Gallops,Rubs or new Murmurs,  +ve B.Sounds, Abd Soft, No tenderness,   No Cyanosis, Clubbing or edema      Data Review:    CBC Recent Labs  Lab 06/29/21 0003 06/29/21 0502 06/30/21 0336 07/02/21 0129  WBC 7.5 5.2 5.0 3.2*  HGB 11.3* 10.2* 10.5* 10.6*  HCT 33.6* 31.3* 31.7* 32.9*  PLT 120* 91* 88* 118*  MCV 83.2 85.1 82.3 84.1  MCH 28.0 27.7 27.3 27.1  MCHC 33.6 32.6 33.1 32.2  RDW 12.4 12.7 12.9 13.5  LYMPHSABS 0.6* 0.5*  --   --   MONOABS 0.5 0.5  --   --   EOSABS 0.0 0.0  --   --   BASOSABS 0.0 0.0  --   --     Electrolytes Recent Labs  Lab 06/29/21 0003 06/29/21 0012 06/29/21 0502 06/29/21 0913 06/29/21 1846 06/29/21 2158 06/30/21 0336 07/01/21 0313 07/02/21 0129 07/02/21 0758  NA 134*  --  134*  --   --   --  132* 137 139  --   K 2.8*  --  3.1*  --   --   --  3.4* 5.5* 4.4  --   CL 103  --  104  --   --   --  106 111 108  --   CO2 25  --  23  --   --   --  23 21* 26  --   GLUCOSE 215*  --  229*  --   --   --  259* 71 102*  --   BUN 13  --  11  --   --   --  5* <5* 6  --   CREATININE 1.06*  --  0.93  --   --   --  0.95 0.82 0.89  --   CALCIUM 8.0*  --  7.6*  --   --   --  7.3* 7.4* 7.8*  --   AST 35  --  71*  --   --   --  50*  --   --   --  ALT 20  --  41  --   --   --  40  --   --   --   ALKPHOS 72  --  73  --   --   --  72  --   --   --   BILITOT 0.7  --  0.8  --   --   --  0.4  --   --   --   ALBUMIN 3.2*  --  2.5*  --   --   --  2.3*  --  2.3*  --   MG  --   --  1.4*  --   --   --  1.6*  2.4  --  2.1  CRP  --   --  5.2*  --   --   --  2.9*  --   --  0.6  DDIMER  --   --   --   --   --   --  1.80*  --   --  0.87*  PROCALCITON  --   --  28.23  --   --   --   --   --   --   --   LATICACIDVEN 1.3  --   --  1.6 2.7* 0.9 1.5  --   --   --   INR  --  1.1  --   --   --   --   --   --   --   --   TSH  --   --   --   --  0.545  --   --   --   --   --   HGBA1C  --   --  6.2*  --   --   --   --   --   --   --   BNP  --   --   --   --   --   --   --   --   --  86.7    ------------------------------------------------------------------------------------------------------------------ No results for input(s): CHOL, HDL, LDLCALC, TRIG, CHOLHDL, LDLDIRECT in the last 72 hours.  Lab Results  Component Value Date   HGBA1C 6.2 (H) 06/29/2021    Recent Labs    06/29/21 1846  TSH 0.545   Radiology Reports DG Chest 1 View  Result Date: 06/29/2021 CLINICAL DATA:  Fever and chills. EXAM: CHEST  1 VIEW COMPARISON:  05/29/2014. FINDINGS: The heart size and mediastinal contours are within normal limits. Both lungs are clear. No acute osseous abnormality. IMPRESSION: No acute cardiopulmonary process. Electronically Signed   By: Thornell Sartorius M.D.   On: 06/29/2021 01:48   CT ABDOMEN PELVIS W CONTRAST  Result Date: 06/29/2021 CLINICAL DATA:  Abdominal pain, acute, nonlocalized. EXAM: CT ABDOMEN AND PELVIS WITH CONTRAST TECHNIQUE: Multidetector CT imaging of the abdomen and pelvis was performed using the standard protocol following bolus administration of intravenous contrast. CONTRAST:  65mL OMNIPAQUE IOHEXOL 300 MG/ML  SOLN COMPARISON:  02/22/2018. FINDINGS: Lower chest: Mild dependent atelectasis at the lung bases. Hepatobiliary: No focal liver abnormality is seen. No gallstones, gallbladder wall thickening, or biliary dilatation. Pancreas: Unremarkable. No pancreatic ductal dilatation or surrounding inflammatory changes. Spleen: Normal in size without focal abnormality. Adrenals/Urinary Tract:  Adrenal glands are unremarkable. Kidneys are normal, without renal calculi, focal lesion, or hydronephrosis. The kidneys enhance symmetrically. Bladder is unremarkable. Stomach/Bowel: No bowel obstruction, free air or pneumatosis. There is mild gastric wall thickening. The  appendix is not clearly visualized on exam. Vascular/Lymphatic: Aortic atherosclerosis. No enlarged abdominal or pelvic lymph nodes. Reproductive: Uterus and bilateral adnexa are unremarkable. Other: Trace amount of free fluid in the right lower quadrant. Small fat containing umbilical hernia. Musculoskeletal: Mild degenerative changes in the thoracolumbar spine. No acute osseous abnormality. IMPRESSION: 1. No bowel obstruction or free air. The appendix is not definitely visualized on exam. If there is clinical concern for appendicitis, repeat evaluation with oral contrast is suggested. 2. Mild gastric wall thickening, possible gastritis. 3. Trace amount of free fluid in the right lower quadrant. Electronically Signed   By: Thornell Sartorius M.D.   On: 06/29/2021 04:47   DG Chest Port 1 View  Result Date: 07/02/2021 CLINICAL DATA:  Shortness of breath.  Coronavirus infection. EXAM: PORTABLE CHEST 1 VIEW COMPARISON:  06/29/2021 FINDINGS: Artifact overlies the chest. Heart size is normal. Mediastinal shadows are normal. There is patchy infiltrate in the left lower lobe. The lungs otherwise appear clear. No visible effusion. No abnormal bone finding. IMPRESSION: Patchy left lower lobe pneumonia. Electronically Signed   By: Paulina Fusi M.D.   On: 07/02/2021 08:08   ECHOCARDIOGRAM COMPLETE  Result Date: 06/29/2021    ECHOCARDIOGRAM REPORT   Patient Name:   [facility]  Date of Exam: 06/29/2021 Medical Rec #:  [id number]  Height:       60.0 in Accession #:    [id number] Weight:       130.9 lb Date of Birth:  [birth year]   BSA:          1.559 m Patient Age:    50 years   BP:           91/57 mmHg Patient Gender: F          HR:           73 bpm. Exam  Location:  Inpatient Procedure: 2D Echo, Cardiac Doppler and Color Doppler Indications:    Other cardiac sounds R01.2  History:        Patient has no prior history of Echocardiogram examinations.  Sonographer:    Eulah Pont RDCS Referring Phys: [id number] Chrissie Noa S MINOR IMPRESSIONS  1. Left ventricular ejection fraction, by estimation, is 65 to 70%. The left ventricle has normal function. The left ventricle has no regional wall motion abnormalities. Left ventricular diastolic parameters were normal.  2. Right ventricular systolic function is normal. The right ventricular size is normal. There is normal pulmonary artery systolic pressure.  3. The mitral valve is normal in structure. Trivial mitral valve regurgitation. No evidence of mitral stenosis.  4. The aortic valve is grossly normal. Aortic valve regurgitation is not visualized. No aortic stenosis is present.  5. The inferior vena cava is normal in size with greater than 50% respiratory variability, suggesting right atrial pressure of 3 mmHg. Comparison(s): No prior Echocardiogram. Conclusion(s)/Recommendation(s): Normal biventricular function without evidence of hemodynamically significant valvular heart disease. FINDINGS  Left Ventricle: Left ventricular ejection fraction, by estimation, is 65 to 70%. The left ventricle has normal function. The left ventricle has no regional wall motion abnormalities. The left ventricular internal cavity size was normal in size. There is  no left ventricular hypertrophy. Left ventricular diastolic parameters were normal. Right Ventricle: The right ventricular size is normal. Right vetricular wall thickness was not well visualized. Right ventricular systolic function is normal. There is normal pulmonary artery systolic pressure. The tricuspid regurgitant velocity is 2.20 m/s, and with an assumed right atrial pressure of 3 mmHg, the  estimated right ventricular systolic pressure is 22.4 mmHg. Left Atrium: Left atrial size was  normal in size. Right Atrium: Right atrial size was normal in size. Pericardium: There is no evidence of pericardial effusion. Mitral Valve: The mitral valve is normal in structure. Trivial mitral valve regurgitation. No evidence of mitral valve stenosis. Tricuspid Valve: The tricuspid valve is normal in structure. Tricuspid valve regurgitation is mild . No evidence of tricuspid stenosis. Aortic Valve: The aortic valve is grossly normal. Aortic valve regurgitation is not visualized. No aortic stenosis is present. Pulmonic Valve: The pulmonic valve was grossly normal. Pulmonic valve regurgitation is trivial. No evidence of pulmonic stenosis. Aorta: The aortic root, ascending aorta, aortic arch and descending aorta are all structurally normal, with no evidence of dilitation or obstruction. Venous: The inferior vena cava is normal in size with greater than 50% respiratory variability, suggesting right atrial pressure of 3 mmHg. IAS/Shunts: The atrial septum is grossly normal.  LEFT VENTRICLE PLAX 2D LVIDd:         4.00 cm     Diastology LVIDs:         2.10 cm     LV e' medial:    9.02 cm/s LV PW:         0.90 cm     LV E/e' medial:  8.7 LV IVS:        0.70 cm     LV e' lateral:   10.10 cm/s LVOT diam:     1.90 cm     LV E/e' lateral: 7.8 LV SV:         45 LV SV Index:   29 LVOT Area:     2.84 cm  LV Volumes (MOD) LV vol d, MOD A2C: 66.5 ml LV vol d, MOD A4C: 50.6 ml LV vol s, MOD A2C: 17.8 ml LV vol s, MOD A4C: 16.3 ml LV SV MOD A2C:     48.7 ml LV SV MOD A4C:     50.6 ml LV SV MOD BP:      41.9 ml RIGHT VENTRICLE RV S prime:     12.40 cm/s TAPSE (M-mode): 2.1 cm LEFT ATRIUM             Index        RIGHT ATRIUM           Index LA diam:        3.10 cm 1.99 cm/m   RA Area:     13.80 cm LA Vol (A2C):   24.4 ml 15.65 ml/m  RA Volume:   33.10 ml  21.23 ml/m LA Vol (A4C):   22.9 ml 14.69 ml/m LA Biplane Vol: 24.1 ml 15.46 ml/m  AORTIC VALVE LVOT Vmax:   75.20 cm/s LVOT Vmean:  51.400 cm/s LVOT VTI:    0.160 m  AORTA  Ao Root diam: 2.90 cm Ao Asc diam:  2.70 cm MITRAL VALVE               TRICUSPID VALVE MV Area (PHT): 2.87 cm    TR Peak grad:   19.4 mmHg MV Decel Time: 264 msec    TR Vmax:        220.00 cm/s MV E velocity: 78.40 cm/s MV A velocity: 55.30 cm/s  SHUNTS MV E/A ratio:  1.42        Systemic VTI:  0.16 m  Systemic Diam: 1.90 cm Jodelle Red MD Electronically signed by Jodelle Red MD Signature Date/Time: 06/29/2021/2:10:03 PM    Final

## 2021-07-02 NOTE — TOC Progression Note (Signed)
Transition of Care [facility]) - Progression Note    Patient Details  Name: Alexee Delsanto MRN: [id number] Date of Birth: 14-Apr-1971  Transition of Care [facility]) CM/SW Contact  Carley Hammed, [place] Phone Number: 07/02/2021, 1:00 PM  Clinical Narrative:      Transition of Care (TOC) Screening Note   Patient Details  Name: Kimaya Whitlatch Date of Birth: [birth year]   Transition of Care [facility]) CM/SW Contact:    Carley Hammed, LCSWA Phone Number: 07/02/2021, 1:01 PM    Transition of Care Department [facility]) has reviewed patient and no TOC needs have been identified at this time. We will continue to monitor patient advancement through interdisciplinary progression rounds. If new patient transition needs arise, please place a TOC consult.         Expected Discharge Plan and Services                                                 Social Determinants of Health (SDOH) Interventions    Readmission Risk Interventions No flowsheet data found.

## 2021-07-02 NOTE — Plan of Care (Signed)
  Problem: Education: Goal: Knowledge of risk factors and measures for prevention of condition will improve Outcome: Progressing   Problem: Coping: Goal: Psychosocial and spiritual needs will be supported Outcome: Progressing   Problem: Respiratory: Goal: Will maintain a patent airway Outcome: Progressing Goal: Complications related to the disease process, condition or treatment will be avoided or minimized Outcome: Progressing   

## 2021-07-02 NOTE — Evaluation (Signed)
Occupational Therapy Evaluation Patient Details Name: Heather Poole MRN: [id number] DOB: 16-Apr-1971 Today's Date: 07/02/2021   History of Present Illness Pt is a 50 y/o female admitted 06/29/21 with 2 days of malaise and dysuria. Found with sepsis due to UTI. 12/21 transfer to ICU from hypotension; off pressors 12/22. COVID +. Chest xray 12/23 L lower lobe PNA. No PMH on file.   Clinical Impression   PTA patient independent and working part time.  Admitted for above and presenting with problem list below, including generalized weakness, decreased activity tolerance, impaired balance.  She is oriented and follows commands with increased time, no translator needed but anticipate requires increased time due to primary language is Counselling psychologist.  Patient requires up to min assist for ADLs, min guard for mobility in room.  VSS during session, mild dizziness with initial activity but BP stable.  She reports she feels close to baseline.  Based on performance today, believe she will benefit from further OT services acutely to optimize independence but anticipate no further needs after dc home.      Recommendations for follow up therapy are one component of a multi-disciplinary discharge planning process, led by the attending physician.  Recommendations may be updated based on patient status, additional functional criteria and insurance authorization.   Follow Up Recommendations  No OT follow up    Assistance Recommended at Discharge Intermittent Supervision/Assistance  Functional Status Assessment  Patient has had a recent decline in their functional status and demonstrates the ability to make significant improvements in function in a reasonable and predictable amount of time.  Equipment Recommendations  BSC/3in1    Recommendations for Other Services       Precautions / Restrictions Precautions Precautions: Fall Restrictions Weight Bearing Restrictions: No      Mobility Bed Mobility Overal bed  mobility: Modified Independent             General bed mobility comments: HOB elevated, no assist required    Transfers                          Balance Overall balance assessment: Mild deficits observed, not formally tested                                         ADL either performed or assessed with clinical judgement   ADL Overall ADL's : Needs assistance/impaired     Grooming: Supervision/safety;Wash/dry hands;Wash/dry face;Oral care;Standing           Upper Body Dressing : Minimal assistance;Sitting   Lower Body Dressing: Minimal assistance;Sit to/from stand   Toilet Transfer: Min guard;Ambulation;Regular Social worker and Hygiene: Supervision/safety;Sitting/lateral lean       Functional mobility during ADLs: Min guard;Cueing for safety       Vision   Vision Assessment?: No apparent visual deficits     Perception     Praxis      Pertinent Vitals/Pain Pain Assessment: Faces Faces Pain Scale: Hurts a little bit Pain Location: HA Pain Descriptors / Indicators: Headache Pain Intervention(s): Monitored during session     Hand Dominance Right   Extremity/Trunk Assessment Upper Extremity Assessment Upper Extremity Assessment: Generalized weakness (L UE edema)   Lower Extremity Assessment Lower Extremity Assessment: Defer to PT evaluation       Communication Communication Communication: Prefers language other than English (able to understand english-  but not first language)   Cognition Arousal/Alertness: Awake/alert Behavior During Therapy: WFL for tasks assessed/performed Overall Cognitive Status: Within Functional Limits for tasks assessed                                 General Comments: appears WFL, some difficulty understanding but antiipcate due to language barrier.  Pt reports feeling back to baseline.     General Comments  VSS, reports mild dizziness when standing  but fades with prolonged standing.  BP stable. SpO2 98-100 on RA    Exercises     Shoulder Instructions      Home Living Family/patient expects to be discharged to:: Private residence Living Arrangements: Children (nephew) Available Help at Discharge: Family;Available 24 hours/day Type of Home: House       Home Layout: One level     Bathroom Shower/Tub: Chief Strategy Officer: Standard     Home Equipment: None          Prior Functioning/Environment Prior Level of Function : Independent/Modified Independent               ADLs Comments: independent, drives, part time nail tech        OT Problem List: Decreased strength;Decreased activity tolerance;Decreased knowledge of use of DME or AE;Decreased knowledge of precautions;Increased edema;Impaired balance (sitting and/or standing)      OT Treatment/Interventions: Self-care/ADL training;Therapeutic exercise;Energy conservation;DME and/or AE instruction;Therapeutic activities;Cognitive remediation/compensation;Patient/family education;Balance training    OT Goals(Current goals can be found in the care plan section) Acute Rehab OT Goals Patient Stated Goal: home OT Goal Formulation: With patient Time For Goal Achievement: 07/16/21 Potential to Achieve Goals: Good  OT Frequency: Min 2X/week   Barriers to D/C:            Co-evaluation              AM-PAC OT "6 Clicks" Daily Activity     Outcome Measure Help from another person eating meals?: A Little Help from another person taking care of personal grooming?: A Little Help from another person toileting, which includes using toliet, bedpan, or urinal?: A Little Help from another person bathing (including washing, rinsing, drying)?: A Little Help from another person to put on and taking off regular upper body clothing?: A Little Help from another person to put on and taking off regular lower body clothing?: A Little 6 Click Score: 18   End  of Session Nurse Communication: Mobility status  Activity Tolerance: Patient tolerated treatment well Patient left: in bed;with call bell/phone within reach;with family/visitor present  OT Visit Diagnosis: Muscle weakness (generalized) (M62.81);Unsteadiness on feet (R26.81)                Time: 4103-0131 OT Time Calculation (min): 29 min Charges:  OT General Charges $OT Visit: 1 Visit OT Evaluation $OT Eval Moderate Complexity: 1 Mod OT Treatments $Self Care/Home Management : 8-22 mins  Barry Brunner, OT Acute Rehabilitation Services Pager [phone] Office [phone]   Heather Poole 07/02/2021, 11:05 AM

## 2021-07-03 DIAGNOSIS — N390 Urinary tract infection, site not specified: Secondary | ICD-10-CM | POA: Diagnosis not present

## 2021-07-03 DIAGNOSIS — A419 Sepsis, unspecified organism: Secondary | ICD-10-CM | POA: Diagnosis not present

## 2021-07-03 LAB — PROCALCITONIN: Procalcitonin: 3.31 ng/mL

## 2021-07-03 LAB — CBC WITH DIFFERENTIAL/PLATELET
Abs Immature Granulocytes: 0 10*3/uL (ref 0.00–0.07)
Basophils Absolute: 0 10*3/uL (ref 0.0–0.1)
Basophils Relative: 0 %
Eosinophils Absolute: 0.1 10*3/uL (ref 0.0–0.5)
Eosinophils Relative: 4 %
HCT: 33.5 % — ABNORMAL LOW (ref 36.0–46.0)
Hemoglobin: 11.2 g/dL — ABNORMAL LOW (ref 12.0–15.0)
Immature Granulocytes: 0 %
Lymphocytes Relative: 49 %
Lymphs Abs: 1.8 10*3/uL (ref 0.7–4.0)
MCH: 27.5 pg (ref 26.0–34.0)
MCHC: 33.4 g/dL (ref 30.0–36.0)
MCV: 82.3 fL (ref 80.0–100.0)
Monocytes Absolute: 0.2 10*3/uL (ref 0.1–1.0)
Monocytes Relative: 7 %
Neutro Abs: 1.4 10*3/uL — ABNORMAL LOW (ref 1.7–7.7)
Neutrophils Relative %: 40 %
Platelets: 130 10*3/uL — ABNORMAL LOW (ref 150–400)
RBC: 4.07 MIL/uL (ref 3.87–5.11)
RDW: 13.2 % (ref 11.5–15.5)
WBC: 3.6 10*3/uL — ABNORMAL LOW (ref 4.0–10.5)
nRBC: 0 % (ref 0.0–0.2)

## 2021-07-03 LAB — COMPREHENSIVE METABOLIC PANEL
ALT: 33 U/L (ref 0–44)
AST: 43 U/L — ABNORMAL HIGH (ref 15–41)
Albumin: 2.5 g/dL — ABNORMAL LOW (ref 3.5–5.0)
Alkaline Phosphatase: 62 U/L (ref 38–126)
Anion gap: 7 (ref 5–15)
BUN: 5 mg/dL — ABNORMAL LOW (ref 6–20)
CO2: 27 mmol/L (ref 22–32)
Calcium: 8.4 mg/dL — ABNORMAL LOW (ref 8.9–10.3)
Chloride: 106 mmol/L (ref 98–111)
Creatinine, Ser: 0.8 mg/dL (ref 0.44–1.00)
GFR, Estimated: >60 mL/min (ref >60)
Glucose, Bld: 103 mg/dL — ABNORMAL HIGH (ref 70–99)
Potassium: 4 mmol/L (ref 3.5–5.1)
Sodium: 140 mmol/L (ref 135–145)
Total Bilirubin: 0.2 mg/dL — ABNORMAL LOW (ref 0.3–1.2)
Total Protein: 5.5 g/dL — ABNORMAL LOW (ref 6.5–8.1)

## 2021-07-03 LAB — GLUCOSE, CAPILLARY
Glucose-Capillary: 87 mg/dL (ref 70–99)
Glucose-Capillary: 106 mg/dL — ABNORMAL HIGH (ref 70–99)
Glucose-Capillary: 98 mg/dL (ref 70–99)
Glucose-Capillary: 200 mg/dL — ABNORMAL HIGH (ref 70–99)

## 2021-07-03 LAB — C-REACTIVE PROTEIN: CRP: 0.5 mg/dL (ref <1.0)

## 2021-07-03 LAB — D-DIMER, QUANTITATIVE: D-Dimer, Quant: 0.81 ug/mL-FEU — ABNORMAL HIGH (ref 0.00–0.50)

## 2021-07-03 LAB — BRAIN NATRIURETIC PEPTIDE: B Natriuretic Peptide: 153.3 pg/mL — ABNORMAL HIGH (ref 0.0–100.0)

## 2021-07-03 LAB — MAGNESIUM: Magnesium: 1.9 mg/dL (ref 1.7–2.4)

## 2021-07-03 NOTE — Plan of Care (Signed)
  Problem: Education: Goal: Knowledge of risk factors and measures for prevention of condition will improve Outcome: Progressing   Problem: Coping: Goal: Psychosocial and spiritual needs will be supported Outcome: Progressing   Problem: Respiratory: Goal: Will maintain a patent airway Outcome: Progressing Goal: Complications related to the disease process, condition or treatment will be avoided or minimized Outcome: Progressing   

## 2021-07-03 NOTE — Progress Notes (Signed)
PROGRESS NOTE                                                                                                                                                                                                             Patient Demographics:    Heather Poole, is a 50 y.o. female, DOB - Apr 29, 1971, ZOX:[id number]  Outpatient Primary MD for the patient is Patient, No Pcp Per (Inactive)    LOS - 4  Admit date - 06/28/2021    Chief Complaint  Patient presents with   Weakness       Brief Narrative (HPI from H&P)   50 yr old female with a history of UTIs, hypothyroidism, who presents with left lower abdominal pain and painful urination without fevers chills sweats or productive cough.  She is a COVID positive is an incidental finding, she was admitted for sepsis to ICU now stabilized transferred to [facility] on 07/02/2021 on day 3 of her hospital stay.   Subjective:   Patient in bed, appears comfortable, denies any headache, no fever, no chest pain or pressure, no shortness of breath , no abdominal pain. No new focal weakness.    Assessment  & Plan :     Sepsis due to UTI.  History of recurrent UTIs and ESBL.  Currently on IV meropenem complete 5-day treatment, cultures so far negative sepsis pathophysiology has resolved.  Procalcitonin trending down and cultures so far unremarkable.  2.  Hypothyroidism.  On Synthroid.  3.  Persistent hypotension.  Sepsis pathophysiology has resolved, stable TSH and cortisol, blood pressure better after another liter of fluid on 07/02/2021 .  Continue midodrine and monitor.      Condition - Fair  Family Communication  :  family bedside 07/02/21  Code Status :  Full  Consults  :  PCCM  PUD Prophylaxis :     Procedures  :     CT - 1. No bowel obstruction or free air. The appendix is not definitely visualized on exam. If there is clinical concern for appendicitis, repeat evaluation with oral  contrast is suggested. 2. Mild gastric wall thickening, possible gastritis. 3. Trace amount of free fluid in the right lower quadrant.   TTE - 1. Left ventricular ejection fraction, by estimation, is 65 to 70%. The left ventricle has normal function. The left ventricle has no  regional wall motion abnormalities. Left ventricular diastolic parameters were normal.  2. Right ventricular systolic function is normal. The right ventricular size is normal. There is normal pulmonary artery systolic pressure.  3. The mitral valve is normal in structure. Trivial mitral valve regurgitation. No evidence of mitral stenosis.  4. The aortic valve is grossly normal. Aortic valve regurgitation is not visualized. No aortic stenosis is present.  5. The inferior vena cava is normal in size with greater than 50% respiratory variability, suggesting right atrial pressure of 3 mmHg.       Disposition Plan  :    Status is: Inpatient  Remains inpatient appropriate because: Sepsis  DVT Prophylaxis  :    enoxaparin (LOVENOX) injection 40 mg Start: 06/29/21 1000  Lab Results  Component Value Date   PLT 130 (L) 07/03/2021    Diet :  Diet Order             Diet Carb Modified Fluid consistency: Thin; Room service appropriate? Yes  Diet effective now                    Inpatient Medications  Scheduled Meds:  vitamin C  500 mg Oral Daily   Chlorhexidine Gluconate Cloth  6 each Topical Daily   enoxaparin (LOVENOX) injection  40 mg Subcutaneous Daily   insulin aspart  0-15 Units Subcutaneous TID AC & HS   levothyroxine  50 mcg Oral Q0600   midodrine  10 mg Oral TID WC   [START ON 07/04/2021] Vitamin D (Ergocalciferol)  50,000 Units Oral Weekly   zinc sulfate  220 mg Oral Daily   Continuous Infusions:  sodium chloride 10 mL/hr at 07/03/21 0600   meropenem (MERREM) IV 1 g (07/03/21 1116)   PRN Meds:.acetaminophen **OR** acetaminophen, albuterol, guaiFENesin-dextromethorphan, ondansetron **OR** ondansetron  (ZOFRAN) IV, polyethylene glycol  Antibiotics  :    Anti-infectives (From admission, onward)    Start     Dose/Rate Route Frequency Ordered Stop   06/29/21 2200  vancomycin (VANCOREADY) IVPB 750 mg/150 mL  Status:  Discontinued        750 mg 150 mL/hr over 60 Minutes Intravenous Every 24 hours 06/29/21 0144 06/29/21 0413   06/29/21 1945  meropenem (MERREM) 1 g in sodium chloride 0.9 % 100 mL IVPB        1 g 200 mL/hr over 30 Minutes Intravenous Every 8 hours 06/29/21 1849 07/04/21 1759   06/29/21 1000  ceFEPIme (MAXIPIME) 2 g in sodium chloride 0.9 % 100 mL IVPB  Status:  Discontinued        2 g 200 mL/hr over 30 Minutes Intravenous Every 12 hours 06/29/21 0142 06/29/21 0733   06/29/21 1000  meropenem (MERREM) 1 g in sodium chloride 0.9 % 100 mL IVPB  Status:  Discontinued        1 g 200 mL/hr over 30 Minutes Intravenous Every 12 hours 06/29/21 0858 06/29/21 1849   06/29/21 0700  nirmatrelvir/ritonavir EUA (PAXLOVID) 3 tablet        3 tablet Oral 2 times daily 06/29/21 0513 07/01/21 2133   06/29/21 0500  metroNIDAZOLE (FLAGYL) IVPB 500 mg  Status:  Discontinued        500 mg 100 mL/hr over 60 Minutes Intravenous 2 times daily 06/29/21 0412 06/29/21 0913   06/29/21 0015  ceFEPIme (MAXIPIME) 2 g in sodium chloride 0.9 % 100 mL IVPB        2 g 200 mL/hr over 30 Minutes Intravenous  Once 06/29/21  0013 06/29/21 0213   06/29/21 0015  metroNIDAZOLE (FLAGYL) IVPB 500 mg  Status:  Discontinued        500 mg 100 mL/hr over 60 Minutes Intravenous  Once 06/29/21 0013 06/29/21 0205   06/29/21 0015  vancomycin (VANCOCIN) IVPB 1000 mg/200 mL premix  Status:  Discontinued        1,000 mg 200 mL/hr over 60 Minutes Intravenous  Once 06/29/21 0013 06/29/21 0206        Time Spent in minutes  30   Susa Raring M.D on 07/03/2021 at 11:18 AM  To page go to [email or web address]   [facility] -  Office  [phone]  See all Orders from today for further details    Objective:   Vitals:    07/02/21 2000 07/03/21 0000 07/03/21 0400 07/03/21 0759  BP: 120/68 (!) 108/50 (!) 101/55 119/68  Pulse: 64 (!) 55 (!) 58 68  Resp: 17 14 16  (!) 22  Temp: 97.8 F (36.6 C) 98.6 F (37 C) 98.4 F (36.9 C) 98.3 F (36.8 C)  TempSrc: Oral Oral Oral Oral  SpO2: 99% 96% 95%   Weight:      Height:        Wt Readings from Last 3 Encounters:  06/30/21 63.6 kg     Intake/Output Summary (Last 24 hours) at 07/03/2021 1118 Last data filed at 07/03/2021 0600 Gross per 24 hour  Intake 724.02 ml  Output --  Net 724.02 ml     Physical Exam  Awake Alert, No new F.N deficits, Normal affect [place].AT,PERRAL Supple Neck, No JVD,   Symmetrical Chest wall movement, Good air movement bilaterally, CTAB RRR,No Gallops, Rubs or new Murmurs,  +ve B.Sounds, Abd Soft, No tenderness,   No Cyanosis, Clubbing or edema       Data Review:    CBC Recent Labs  Lab 06/29/21 0003 06/29/21 0502 06/30/21 0336 07/02/21 0129 07/03/21 0216  WBC 7.5 5.2 5.0 3.2* 3.6*  HGB 11.3* 10.2* 10.5* 10.6* 11.2*  HCT 33.6* 31.3* 31.7* 32.9* 33.5*  PLT 120* 91* 88* 118* 130*  MCV 83.2 85.1 82.3 84.1 82.3  MCH 28.0 27.7 27.3 27.1 27.5  MCHC 33.6 32.6 33.1 32.2 33.4  RDW 12.4 12.7 12.9 13.5 13.2  LYMPHSABS 0.6* 0.5*  --   --  1.8  MONOABS 0.5 0.5  --   --  0.2  EOSABS 0.0 0.0  --   --  0.1  BASOSABS 0.0 0.0  --   --  0.0    Electrolytes Recent Labs  Lab 06/29/21 0003 06/29/21 0012 06/29/21 0502 06/29/21 0913 06/29/21 1846 06/29/21 2158 06/30/21 0336 07/01/21 0313 07/02/21 0129 07/02/21 0758 07/03/21 0216  NA 134*  --  134*  --   --   --  132* 137 139  --  140  K 2.8*  --  3.1*  --   --   --  3.4* 5.5* 4.4  --  4.0  CL 103  --  104  --   --   --  106 111 108  --  106  CO2 25  --  23  --   --   --  23 21* 26  --  27  GLUCOSE 215*  --  229*  --   --   --  259* 71 102*  --  103*  BUN 13  --  11  --   --   --  5* <5* 6  --  5*  CREATININE 1.06*  --  0.93  --   --   --  0.95 0.82 0.89  --  0.80   CALCIUM 8.0*  --  7.6*  --   --   --  7.3* 7.4* 7.8*  --  8.4*  AST 35  --  71*  --   --   --  50*  --   --   --  43*  ALT 20  --  41  --   --   --  40  --   --   --  33  ALKPHOS 72  --  73  --   --   --  72  --   --   --  62  BILITOT 0.7  --  0.8  --   --   --  0.4  --   --   --  0.2*  ALBUMIN 3.2*  --  2.5*  --   --   --  2.3*  --  2.3*  --  2.5*  MG  --   --  1.4*  --   --   --  1.6* 2.4  --  2.1 1.9  CRP  --   --  5.2*  --   --   --  2.9*  --   --  0.6 0.5  DDIMER  --   --   --   --   --   --  1.80*  --   --  0.87* 0.81*  PROCALCITON  --   --  28.23  --   --   --   --   --  7.01  --  3.31  LATICACIDVEN 1.3  --   --  1.6 2.7* 0.9 1.5  --   --   --   --   INR  --  1.1  --   --   --   --   --   --   --   --   --   TSH  --   --   --   --  0.545  --   --   --   --  4.857*  --   HGBA1C  --   --  6.2*  --   --   --   --   --   --   --   --   BNP  --   --   --   --   --   --   --   --   --  86.7 153.3*    ------------------------------------------------------------------------------------------------------------------ No results for input(s): CHOL, HDL, LDLCALC, TRIG, CHOLHDL, LDLDIRECT in the last 72 hours.  Lab Results  Component Value Date   HGBA1C 6.2 (H) 06/29/2021    Recent Labs    07/02/21 0758  TSH 4.857*   Radiology Reports DG Chest Port 1 View  Result Date: 07/02/2021 CLINICAL DATA:  Shortness of breath.  Coronavirus infection. EXAM: PORTABLE CHEST 1 VIEW COMPARISON:  06/29/2021 FINDINGS: Artifact overlies the chest. Heart size is normal. Mediastinal shadows are normal. There is patchy infiltrate in the left lower lobe. The lungs otherwise appear clear. No visible effusion. No abnormal bone finding. IMPRESSION: Patchy left lower lobe pneumonia. Electronically Signed   By: Paulina Fusi M.D.   On: 07/02/2021 08:08   ECHOCARDIOGRAM COMPLETE  Result Date: 06/29/2021    ECHOCARDIOGRAM REPORT   Patient Name:   Heather Poole  Date of Exam: 06/29/2021 Medical Rec #:  [id number]   Height:  60.0 in Accession #:    [id number] Weight:       130.9 lb Date of Birth:  1970/10/11   BSA:          1.559 m Patient Age:    50 years   BP:           91/57 mmHg Patient Gender: F          HR:           73 bpm. Exam Location:  Inpatient Procedure: 2D Echo, Cardiac Doppler and Color Doppler Indications:    Other cardiac sounds R01.2  History:        Patient has no prior history of Echocardiogram examinations.  Sonographer:    Eulah Pont RDCS Referring Phys: [id number] Chrissie Noa S MINOR IMPRESSIONS  1. Left ventricular ejection fraction, by estimation, is 65 to 70%. The left ventricle has normal function. The left ventricle has no regional wall motion abnormalities. Left ventricular diastolic parameters were normal.  2. Right ventricular systolic function is normal. The right ventricular size is normal. There is normal pulmonary artery systolic pressure.  3. The mitral valve is normal in structure. Trivial mitral valve regurgitation. No evidence of mitral stenosis.  4. The aortic valve is grossly normal. Aortic valve regurgitation is not visualized. No aortic stenosis is present.  5. The inferior vena cava is normal in size with greater than 50% respiratory variability, suggesting right atrial pressure of 3 mmHg. Comparison(s): No prior Echocardiogram. Conclusion(s)/Recommendation(s): Normal biventricular function without evidence of hemodynamically significant valvular heart disease. FINDINGS  Left Ventricle: Left ventricular ejection fraction, by estimation, is 65 to 70%. The left ventricle has normal function. The left ventricle has no regional wall motion abnormalities. The left ventricular internal cavity size was normal in size. There is  no left ventricular hypertrophy. Left ventricular diastolic parameters were normal. Right Ventricle: The right ventricular size is normal. Right vetricular wall thickness was not well visualized. Right ventricular systolic function is normal. There is normal pulmonary  artery systolic pressure. The tricuspid regurgitant velocity is 2.20 m/s, and with an assumed right atrial pressure of 3 mmHg, the estimated right ventricular systolic pressure is 22.4 mmHg. Left Atrium: Left atrial size was normal in size. Right Atrium: Right atrial size was normal in size. Pericardium: There is no evidence of pericardial effusion. Mitral Valve: The mitral valve is normal in structure. Trivial mitral valve regurgitation. No evidence of mitral valve stenosis. Tricuspid Valve: The tricuspid valve is normal in structure. Tricuspid valve regurgitation is mild . No evidence of tricuspid stenosis. Aortic Valve: The aortic valve is grossly normal. Aortic valve regurgitation is not visualized. No aortic stenosis is present. Pulmonic Valve: The pulmonic valve was grossly normal. Pulmonic valve regurgitation is trivial. No evidence of pulmonic stenosis. Aorta: The aortic root, ascending aorta, aortic arch and descending aorta are all structurally normal, with no evidence of dilitation or obstruction. Venous: The inferior vena cava is normal in size with greater than 50% respiratory variability, suggesting right atrial pressure of 3 mmHg. IAS/Shunts: The atrial septum is grossly normal.  LEFT VENTRICLE PLAX 2D LVIDd:         4.00 cm     Diastology LVIDs:         2.10 cm     LV e' medial:    9.02 cm/s LV PW:         0.90 cm     LV E/e' medial:  8.7 LV IVS:  0.70 cm     LV e' lateral:   10.10 cm/s LVOT diam:     1.90 cm     LV E/e' lateral: 7.8 LV SV:         45 LV SV Index:   29 LVOT Area:     2.84 cm  LV Volumes (MOD) LV vol d, MOD A2C: 66.5 ml LV vol d, MOD A4C: 50.6 ml LV vol s, MOD A2C: 17.8 ml LV vol s, MOD A4C: 16.3 ml LV SV MOD A2C:     48.7 ml LV SV MOD A4C:     50.6 ml LV SV MOD BP:      41.9 ml RIGHT VENTRICLE RV S prime:     12.40 cm/s TAPSE (M-mode): 2.1 cm LEFT ATRIUM             Index        RIGHT ATRIUM           Index LA diam:        3.10 cm 1.99 cm/m   RA Area:     13.80 cm LA Vol  (A2C):   24.4 ml 15.65 ml/m  RA Volume:   33.10 ml  21.23 ml/m LA Vol (A4C):   22.9 ml 14.69 ml/m LA Biplane Vol: 24.1 ml 15.46 ml/m  AORTIC VALVE LVOT Vmax:   75.20 cm/s LVOT Vmean:  51.400 cm/s LVOT VTI:    0.160 m  AORTA Ao Root diam: 2.90 cm Ao Asc diam:  2.70 cm MITRAL VALVE               TRICUSPID VALVE MV Area (PHT): 2.87 cm    TR Peak grad:   19.4 mmHg MV Decel Time: 264 msec    TR Vmax:        220.00 cm/s MV E velocity: 78.40 cm/s MV A velocity: 55.30 cm/s  SHUNTS MV E/A ratio:  1.42        Systemic VTI:  0.16 m                            Systemic Diam: 1.90 cm Jodelle Red MD Electronically signed by Jodelle Red MD Signature Date/Time: 06/29/2021/2:10:03 PM    Final

## 2021-07-04 DIAGNOSIS — N390 Urinary tract infection, site not specified: Secondary | ICD-10-CM | POA: Diagnosis not present

## 2021-07-04 DIAGNOSIS — A419 Sepsis, unspecified organism: Secondary | ICD-10-CM | POA: Diagnosis not present

## 2021-07-04 LAB — CULTURE, BLOOD (ROUTINE X 2)
Culture: NO GROWTH
Culture: NO GROWTH
Special Requests: ADEQUATE
Special Requests: ADEQUATE

## 2021-07-04 LAB — CBC WITH DIFFERENTIAL/PLATELET
Abs Immature Granulocytes: 0.01 10*3/uL (ref 0.00–0.07)
Basophils Absolute: 0 10*3/uL (ref 0.0–0.1)
Basophils Relative: 0 %
Eosinophils Absolute: 0.1 10*3/uL (ref 0.0–0.5)
Eosinophils Relative: 3 %
HCT: 33.8 % — ABNORMAL LOW (ref 36.0–46.0)
Hemoglobin: 11.1 g/dL — ABNORMAL LOW (ref 12.0–15.0)
Immature Granulocytes: 0 %
Lymphocytes Relative: 46 %
Lymphs Abs: 1.9 10*3/uL (ref 0.7–4.0)
MCH: 27.3 pg (ref 26.0–34.0)
MCHC: 32.8 g/dL (ref 30.0–36.0)
MCV: 83 fL (ref 80.0–100.0)
Monocytes Absolute: 0.3 10*3/uL (ref 0.1–1.0)
Monocytes Relative: 7 %
Neutro Abs: 1.8 10*3/uL (ref 1.7–7.7)
Neutrophils Relative %: 44 %
Platelets: 132 10*3/uL — ABNORMAL LOW (ref 150–400)
RBC: 4.07 MIL/uL (ref 3.87–5.11)
RDW: 13.2 % (ref 11.5–15.5)
WBC: 4 10*3/uL (ref 4.0–10.5)
nRBC: 0 % (ref 0.0–0.2)

## 2021-07-04 LAB — COMPREHENSIVE METABOLIC PANEL
ALT: 52 U/L — ABNORMAL HIGH (ref 0–44)
AST: 82 U/L — ABNORMAL HIGH (ref 15–41)
Albumin: 2.6 g/dL — ABNORMAL LOW (ref 3.5–5.0)
Alkaline Phosphatase: 69 U/L (ref 38–126)
Anion gap: 7 (ref 5–15)
BUN: 7 mg/dL (ref 6–20)
CO2: 27 mmol/L (ref 22–32)
Calcium: 8.4 mg/dL — ABNORMAL LOW (ref 8.9–10.3)
Chloride: 107 mmol/L (ref 98–111)
Creatinine, Ser: 0.75 mg/dL (ref 0.44–1.00)
GFR, Estimated: >60 mL/min (ref >60)
Glucose, Bld: 88 mg/dL (ref 70–99)
Potassium: 3.7 mmol/L (ref 3.5–5.1)
Sodium: 141 mmol/L (ref 135–145)
Total Bilirubin: 0.4 mg/dL (ref 0.3–1.2)
Total Protein: 5.5 g/dL — ABNORMAL LOW (ref 6.5–8.1)

## 2021-07-04 LAB — GLUCOSE, CAPILLARY
Glucose-Capillary: 174 mg/dL — ABNORMAL HIGH (ref 70–99)
Glucose-Capillary: 130 mg/dL — ABNORMAL HIGH (ref 70–99)
Glucose-Capillary: 87 mg/dL (ref 70–99)
Glucose-Capillary: 187 mg/dL — ABNORMAL HIGH (ref 70–99)

## 2021-07-04 LAB — MAGNESIUM: Magnesium: 2.1 mg/dL (ref 1.7–2.4)

## 2021-07-04 LAB — C-REACTIVE PROTEIN: CRP: 1 mg/dL — ABNORMAL HIGH (ref <1.0)

## 2021-07-04 LAB — PROCALCITONIN: Procalcitonin: 1.4 ng/mL

## 2021-07-04 LAB — D-DIMER, QUANTITATIVE: D-Dimer, Quant: 1.75 ug/mL-FEU — ABNORMAL HIGH (ref 0.00–0.50)

## 2021-07-04 LAB — BRAIN NATRIURETIC PEPTIDE: B Natriuretic Peptide: 103 pg/mL — ABNORMAL HIGH (ref 0.0–100.0)

## 2021-07-04 MED ORDER — LACTATED RINGERS IV SOLN: INTRAVENOUS | Status: DC

## 2021-07-04 MED ORDER — LACTATED RINGERS IV BOLUS
1000.0000 mL | Freq: Once | INTRAVENOUS | Status: AC
Start: 2021-07-04 — End: 2021-07-04
  Administered 2021-07-04: 04:00:00 1000 mL via INTRAVENOUS

## 2021-07-04 NOTE — Progress Notes (Signed)
PROGRESS NOTE                                                                                                                                                                                                             Patient Demographics:    Heather Poole, is a 50 y.o. female, DOB - [birth year], ZOX:[id number]  Outpatient Primary MD for the patient is Patient, No Pcp Per (Inactive)    LOS - 5  Admit date - 06/28/2021    Chief Complaint  Patient presents with   Weakness       Brief Narrative (HPI from H&P)   50 yr old female with a history of UTIs, hypothyroidism, who presents with left lower abdominal pain and painful urination without fevers chills sweats or productive cough.  She is a COVID positive is an incidental finding, she was admitted for sepsis to ICU now stabilized transferred to [facility] on 07/02/2021 on day 3 of her hospital stay.   Subjective:   Patient in bed, appears comfortable, denies any headache, no fever, no chest pain or pressure, no shortness of breath , no abdominal pain. No new focal weakness.   Assessment  & Plan :     Sepsis due to UTI.  History of recurrent UTIs and ESBL.  Currently on IV meropenem complete 5-day treatment, cultures so far negative sepsis pathophysiology has resolved.  Procalcitonin trending down and cultures so far unremarkable.  2.  Hypothyroidism.  On Synthroid.  3.  Persistent hypotension.  Sepsis pathophysiology has resolved, stable TSH and cortisol, continue gentle hydration so far 10 L positive but thankfully no fluid overload, continue scheduled midodrine, TED stockings and increase activity..      Condition - Fair  Family Communication  :  family bedside 07/02/21  Code Status :  Full  Consults  :  PCCM  PUD Prophylaxis :     Procedures  :     CT - 1. No bowel obstruction or free air. The appendix is not definitely visualized on exam. If there is clinical concern  for appendicitis, repeat evaluation with oral contrast is suggested. 2. Mild gastric wall thickening, possible gastritis. 3. Trace amount of free fluid in the right lower quadrant.   TTE - 1. Left ventricular ejection fraction, by estimation, is 65 to 70%. The left ventricle has normal function. The  left ventricle has no regional wall motion abnormalities. Left ventricular diastolic parameters were normal.  2. Right ventricular systolic function is normal. The right ventricular size is normal. There is normal pulmonary artery systolic pressure.  3. The mitral valve is normal in structure. Trivial mitral valve regurgitation. No evidence of mitral stenosis.  4. The aortic valve is grossly normal. Aortic valve regurgitation is not visualized. No aortic stenosis is present.  5. The inferior vena cava is normal in size with greater than 50% respiratory variability, suggesting right atrial pressure of 3 mmHg.       Disposition Plan  :    Status is: Inpatient  Remains inpatient appropriate because: Sepsis  DVT Prophylaxis  :    Place TED hose Start: 07/04/21 0806 enoxaparin (LOVENOX) injection 40 mg Start: 06/29/21 1000  Lab Results  Component Value Date   PLT 132 (L) 07/04/2021    Diet :  Diet Order             Diet Carb Modified Fluid consistency: Thin; Room service appropriate? Yes  Diet effective now                    Inpatient Medications  Scheduled Meds:  vitamin C  500 mg Oral Daily   enoxaparin (LOVENOX) injection  40 mg Subcutaneous Daily   insulin aspart  0-15 Units Subcutaneous TID AC & HS   levothyroxine  50 mcg Oral Q0600   midodrine  10 mg Oral TID WC   Vitamin D (Ergocalciferol)  50,000 Units Oral Weekly   zinc sulfate  220 mg Oral Daily   Continuous Infusions:  sodium chloride Stopped (07/04/21 0602)   lactated ringers     PRN Meds:.acetaminophen **OR** acetaminophen, albuterol, guaiFENesin-dextromethorphan, ondansetron **OR** ondansetron (ZOFRAN) IV,  polyethylene glycol  Antibiotics  :    Anti-infectives (From admission, onward)    Start     Dose/Rate Route Frequency Ordered Stop   06/29/21 2200  vancomycin (VANCOREADY) IVPB 750 mg/150 mL  Status:  Discontinued        750 mg 150 mL/hr over 60 Minutes Intravenous Every 24 hours 06/29/21 0144 06/29/21 0413   06/29/21 1945  meropenem (MERREM) 1 g in sodium chloride 0.9 % 100 mL IVPB        1 g 200 mL/hr over 30 Minutes Intravenous Every 8 hours 06/29/21 1849 07/04/21 0903   06/29/21 1000  ceFEPIme (MAXIPIME) 2 g in sodium chloride 0.9 % 100 mL IVPB  Status:  Discontinued        2 g 200 mL/hr over 30 Minutes Intravenous Every 12 hours 06/29/21 0142 06/29/21 0733   06/29/21 1000  meropenem (MERREM) 1 g in sodium chloride 0.9 % 100 mL IVPB  Status:  Discontinued        1 g 200 mL/hr over 30 Minutes Intravenous Every 12 hours 06/29/21 0858 06/29/21 1849   06/29/21 0700  nirmatrelvir/ritonavir EUA (PAXLOVID) 3 tablet        3 tablet Oral 2 times daily 06/29/21 0513 07/01/21 2133   06/29/21 0500  metroNIDAZOLE (FLAGYL) IVPB 500 mg  Status:  Discontinued        500 mg 100 mL/hr over 60 Minutes Intravenous 2 times daily 06/29/21 0412 06/29/21 0913   06/29/21 0015  ceFEPIme (MAXIPIME) 2 g in sodium chloride 0.9 % 100 mL IVPB        2 g 200 mL/hr over 30 Minutes Intravenous  Once 06/29/21 0013 06/29/21 0213   06/29/21 0015  metroNIDAZOLE (FLAGYL) IVPB 500 mg  Status:  Discontinued        500 mg 100 mL/hr over 60 Minutes Intravenous  Once 06/29/21 0013 06/29/21 0205   06/29/21 0015  vancomycin (VANCOCIN) IVPB 1000 mg/200 mL premix  Status:  Discontinued        1,000 mg 200 mL/hr over 60 Minutes Intravenous  Once 06/29/21 0013 06/29/21 0206        Time Spent in minutes  30   Susa Raring M.D on 07/04/2021 at 10:26 AM  To page go to [email or web address]   [facility] -  Office  [phone]  See all Orders from today for further details    Objective:   Vitals:   07/04/21  0027 07/04/21 0413 07/04/21 0600 07/04/21 0752  BP: (!) 96/57 (!) 86/41 (!) 103/56 101/90  Pulse: 66 72  76  Resp: 18 18  18   Temp: 98.4 F (36.9 C) 98.4 F (36.9 C)  98.4 F (36.9 C)  TempSrc: Oral Oral  Oral  SpO2:  100%    Weight:      Height:        Wt Readings from Last 3 Encounters:  06/30/21 63.6 kg     Intake/Output Summary (Last 24 hours) at 07/04/2021 1026 Last data filed at 07/04/2021 0649 Gross per 24 hour  Intake 1408.27 ml  Output --  Net 1408.27 ml     Physical Exam  Awake Alert, No new F.N deficits, Normal affect [place].AT,PERRAL Supple Neck, No JVD,   Symmetrical Chest wall movement, Good air movement bilaterally, CTAB RRR,No Gallops, Rubs or new Murmurs,  +ve B.Sounds, Abd Soft, No tenderness,   No Cyanosis, Clubbing or edema        Data Review:    CBC Recent Labs  Lab 06/29/21 0003 06/29/21 0502 06/30/21 0336 07/02/21 0129 07/03/21 0216 07/04/21 0136  WBC 7.5 5.2 5.0 3.2* 3.6* 4.0  HGB 11.3* 10.2* 10.5* 10.6* 11.2* 11.1*  HCT 33.6* 31.3* 31.7* 32.9* 33.5* 33.8*  PLT 120* 91* 88* 118* 130* 132*  MCV 83.2 85.1 82.3 84.1 82.3 83.0  MCH 28.0 27.7 27.3 27.1 27.5 27.3  MCHC 33.6 32.6 33.1 32.2 33.4 32.8  RDW 12.4 12.7 12.9 13.5 13.2 13.2  LYMPHSABS 0.6* 0.5*  --   --  1.8 1.9  MONOABS 0.5 0.5  --   --  0.2 0.3  EOSABS 0.0 0.0  --   --  0.1 0.1  BASOSABS 0.0 0.0  --   --  0.0 0.0    Electrolytes Recent Labs  Lab 06/29/21 0003 06/29/21 0003 06/29/21 0012 06/29/21 0502 06/29/21 0913 06/29/21 1846 06/29/21 2158 06/30/21 0336 07/01/21 0313 07/02/21 0129 07/02/21 0758 07/03/21 0216 07/04/21 0136  NA 134*  --   --  134*  --   --   --  132* 137 139  --  140 141  K 2.8*  --   --  3.1*  --   --   --  3.4* 5.5* 4.4  --  4.0 3.7  CL 103  --   --  104  --   --   --  106 111 108  --  106 107  CO2 25  --   --  23  --   --   --  23 21* 26  --  27 27  GLUCOSE 215*  --   --  229*  --   --   --  259* 71 102*  --  103* 88  BUN 13  --   --   11  --   --   --  5* <5* 6  --  5* 7  CREATININE 1.06*  --   --  0.93  --   --   --  0.95 0.82 0.89  --  0.80 0.75  CALCIUM 8.0*  --   --  7.6*  --   --   --  7.3* 7.4* 7.8*  --  8.4* 8.4*  AST 35  --   --  71*  --   --   --  50*  --   --   --  43* 82*  ALT 20  --   --  41  --   --   --  40  --   --   --  33 52*  ALKPHOS 72  --   --  73  --   --   --  72  --   --   --  62 69  BILITOT 0.7  --   --  0.8  --   --   --  0.4  --   --   --  0.2* 0.4  ALBUMIN 3.2*  --   --  2.5*  --   --   --  2.3*  --  2.3*  --  2.5* 2.6*  MG  --    < >  --  1.4*  --   --   --  1.6* 2.4  --  2.1 1.9 2.1  CRP  --   --   --  5.2*  --   --   --  2.9*  --   --  0.6 0.5 1.0*  DDIMER  --   --   --   --   --   --   --  1.80*  --   --  0.87* 0.81* 1.75*  PROCALCITON  --   --   --  28.23  --   --   --   --   --  7.01  --  3.31 1.40  LATICACIDVEN 1.3  --   --   --  1.6 2.7* 0.9 1.5  --   --   --   --   --   INR  --   --  1.1  --   --   --   --   --   --   --   --   --   --   TSH  --   --   --   --   --  0.545  --   --   --   --  4.857*  --   --   HGBA1C  --   --   --  6.2*  --   --   --   --   --   --   --   --   --   BNP  --   --   --   --   --   --   --   --   --   --  86.7 153.3* 103.0*   < > = values in this interval not displayed.    ------------------------------------------------------------------------------------------------------------------ No results for input(s): CHOL, HDL, LDLCALC, TRIG, CHOLHDL, LDLDIRECT in the last 72 hours.  Lab Results  Component Value Date   HGBA1C 6.2 (H) 06/29/2021    Recent Labs    07/02/21 0758  TSH 4.857*  Radiology Reports DG Chest Port 1 View  Result Date: 07/02/2021 CLINICAL DATA:  Shortness of breath.  Coronavirus infection. EXAM: PORTABLE CHEST 1 VIEW COMPARISON:  06/29/2021 FINDINGS: Artifact overlies the chest. Heart size is normal. Mediastinal shadows are normal. There is patchy infiltrate in the left lower lobe. The lungs otherwise appear clear. No visible  effusion. No abnormal bone finding. IMPRESSION: Patchy left lower lobe pneumonia. Electronically Signed   By: Paulina Fusi M.D.   On: 07/02/2021 08:08

## 2021-07-05 DIAGNOSIS — N390 Urinary tract infection, site not specified: Secondary | ICD-10-CM | POA: Diagnosis not present

## 2021-07-05 DIAGNOSIS — A419 Sepsis, unspecified organism: Secondary | ICD-10-CM | POA: Diagnosis not present

## 2021-07-05 LAB — CBC WITH DIFFERENTIAL/PLATELET
Abs Immature Granulocytes: 0.01 10*3/uL (ref 0.00–0.07)
Basophils Absolute: 0 10*3/uL (ref 0.0–0.1)
Basophils Relative: 0 %
Eosinophils Absolute: 0.1 10*3/uL (ref 0.0–0.5)
Eosinophils Relative: 2 %
HCT: 31.9 % — ABNORMAL LOW (ref 36.0–46.0)
Hemoglobin: 10.6 g/dL — ABNORMAL LOW (ref 12.0–15.0)
Immature Granulocytes: 0 %
Lymphocytes Relative: 44 %
Lymphs Abs: 1.9 10*3/uL (ref 0.7–4.0)
MCH: 27.6 pg (ref 26.0–34.0)
MCHC: 33.2 g/dL (ref 30.0–36.0)
MCV: 83.1 fL (ref 80.0–100.0)
Monocytes Absolute: 0.3 10*3/uL (ref 0.1–1.0)
Monocytes Relative: 6 %
Neutro Abs: 2.1 10*3/uL (ref 1.7–7.7)
Neutrophils Relative %: 48 %
Platelets: 182 10*3/uL (ref 150–400)
RBC: 3.84 MIL/uL — ABNORMAL LOW (ref 3.87–5.11)
RDW: 13.3 % (ref 11.5–15.5)
WBC: 4.4 10*3/uL (ref 4.0–10.5)
nRBC: 0 % (ref 0.0–0.2)

## 2021-07-05 LAB — C-REACTIVE PROTEIN: CRP: 0.7 mg/dL (ref <1.0)

## 2021-07-05 LAB — COMPREHENSIVE METABOLIC PANEL
ALT: 49 U/L — ABNORMAL HIGH (ref 0–44)
AST: 58 U/L — ABNORMAL HIGH (ref 15–41)
Albumin: 2.4 g/dL — ABNORMAL LOW (ref 3.5–5.0)
Alkaline Phosphatase: 61 U/L (ref 38–126)
Anion gap: 8 (ref 5–15)
BUN: 6 mg/dL (ref 6–20)
CO2: 27 mmol/L (ref 22–32)
Calcium: 8.1 mg/dL — ABNORMAL LOW (ref 8.9–10.3)
Chloride: 107 mmol/L (ref 98–111)
Creatinine, Ser: 0.67 mg/dL (ref 0.44–1.00)
GFR, Estimated: >60 mL/min (ref >60)
Glucose, Bld: 100 mg/dL — ABNORMAL HIGH (ref 70–99)
Potassium: 3.3 mmol/L — ABNORMAL LOW (ref 3.5–5.1)
Sodium: 142 mmol/L (ref 135–145)
Total Bilirubin: 0.4 mg/dL (ref 0.3–1.2)
Total Protein: 5.3 g/dL — ABNORMAL LOW (ref 6.5–8.1)

## 2021-07-05 LAB — GLUCOSE, CAPILLARY: Glucose-Capillary: 99 mg/dL (ref 70–99)

## 2021-07-05 LAB — D-DIMER, QUANTITATIVE: D-Dimer, Quant: 0.59 ug/mL-FEU — ABNORMAL HIGH (ref 0.00–0.50)

## 2021-07-05 LAB — BRAIN NATRIURETIC PEPTIDE: B Natriuretic Peptide: 124.4 pg/mL — ABNORMAL HIGH (ref 0.0–100.0)

## 2021-07-05 LAB — MAGNESIUM: Magnesium: 1.9 mg/dL (ref 1.7–2.4)

## 2021-07-05 MED ORDER — MIDODRINE HCL 10 MG PO TABS: 10.0000 mg | ORAL_TABLET | Freq: Two times a day (BID) | ORAL | 0 refills | Status: AC

## 2021-07-05 MED ORDER — POTASSIUM CHLORIDE CRYS ER 20 MEQ PO TBCR
40.0000 meq | EXTENDED_RELEASE_TABLET | Freq: Once | ORAL | Status: AC
  Administered 2021-07-05: 08:00:00 40 meq via ORAL
  Filled 2021-07-05: qty 2

## 2021-07-05 NOTE — Plan of Care (Signed)
  Problem: Education: Goal: Knowledge of risk factors and measures for prevention of condition will improve Outcome: Progressing   Problem: Coping: Goal: Psychosocial and spiritual needs will be supported Outcome: Progressing   Problem: Respiratory: Goal: Will maintain a patent airway Outcome: Progressing Goal: Complications related to the disease process, condition or treatment will be avoided or minimized Outcome: Progressing   Problem: Urinary Elimination: Goal: Signs and symptoms of infection will decrease Outcome: Progressing   

## 2021-07-05 NOTE — Progress Notes (Signed)
Occupational Therapy Treatment Patient Details Name: Heather Poole MRN: [id number] DOB: 19-Jan-1971 Today's Date: 07/05/2021   History of present illness Pt is a 50 y/o female admitted 06/29/21 with 2 days of malaise and dysuria. Found with sepsis due to UTI. 12/21 transfer to ICU from hypotension; off pressors 12/22. COVID +. Chest xray 12/23 L lower lobe PNA. No PMH on file.   OT comments  Patient received in room sitting up in chair with husband in room. Patient states she had already completed self care but donned footwear with supervision. Patient ambulated to bathroom with supervision and was modified independent with toileting tasks. Patient performed balance activities without LOB. Patient is a possible discharge home today.    Recommendations for follow up therapy are one component of a multi-disciplinary discharge planning process, led by the attending physician.  Recommendations may be updated based on patient status, additional functional criteria and insurance authorization.    Follow Up Recommendations  No OT follow up    Assistance Recommended at Discharge Intermittent Supervision/Assistance  Equipment Recommendations  BSC/3in1    Recommendations for Other Services      Precautions / Restrictions Precautions Precautions: Fall       Mobility Bed Mobility               General bed mobility comments: up in chair    Transfers Overall transfer level: Modified independent                 General transfer comment: no assistive device needed     Balance Overall balance assessment: No apparent balance deficits (not formally assessed)                                         ADL either performed or assessed with clinical judgement   ADL Overall ADL's : Needs assistance/impaired                     Lower Body Dressing: Supervision/safety Lower Body Dressing Details (indicate cue type and reason): donned footwear Toilet Transfer:  Supervision/safety   Toileting- Clothing Manipulation and Hygiene: Modified independent       Functional mobility during ADLs: Supervision/safety General ADL Comments: no assistive device used for mobility    Extremity/Trunk Assessment              Vision       Perception     Praxis      Cognition Arousal/Alertness: Awake/alert Behavior During Therapy: WFL for tasks assessed/performed Overall Cognitive Status: Within Functional Limits for tasks assessed                                 General Comments: alert and oriented          Exercises     Shoulder Instructions       General Comments      Pertinent Vitals/ Pain       Pain Assessment: No/denies pain Faces Pain Scale: No hurt  Home Living                                          Prior Functioning/Environment              Frequency  Min 2X/week        Progress Toward Goals  OT Goals(current goals can now be found in the care plan section)  Progress towards OT goals: Progressing toward goals  Acute Rehab OT Goals Patient Stated Goal: go home today OT Goal Formulation: With patient Time For Goal Achievement: 07/16/21 Potential to Achieve Goals: Good ADL Goals Pt Will Perform Grooming: Independently;standing Pt Will Perform Lower Body Dressing: Independently;sit to/from stand Pt Will Transfer to Toilet: Independently;ambulating Pt Will Perform Toileting - Clothing Manipulation and hygiene: Independently;sit to/from stand Pt Will Perform Tub/Shower Transfer: Tub transfer;ambulating;3 in 1;with modified independence  Plan Discharge plan remains appropriate    Co-evaluation                 AM-PAC OT "6 Clicks" Daily Activity     Outcome Measure   Help from another person eating meals?: None Help from another person taking care of personal grooming?: None Help from another person toileting, which includes using toliet, bedpan, or urinal?:  None Help from another person bathing (including washing, rinsing, drying)?: A Little Help from another person to put on and taking off regular upper body clothing?: None Help from another person to put on and taking off regular lower body clothing?: A Little 6 Click Score: 22    End of Session    OT Visit Diagnosis: Muscle weakness (generalized) (M62.81);Unsteadiness on feet (R26.81)   Activity Tolerance Patient tolerated treatment well   Patient Left in chair;with call bell/phone within reach;with family/visitor present   Nurse Communication Mobility status        Time: 5638-9373 OT Time Calculation (min): 18 min  Charges: OT General Charges $OT Visit: 1 Visit OT Treatments $Self Care/Home Management : 8-22 mins  Alfonse Flavors, OTA Acute Rehabilitation Services  Pager [phone] Office [phone]   Dewain Penning 07/05/2021, 9:15 AM

## 2021-07-05 NOTE — Discharge Summary (Signed)
Heather Poole ZDG:[id number] DOB: September 30, 1970 DOA: 06/28/2021  PCP: Patient, No Pcp Per (Inactive)  Admit date: 06/28/2021  Discharge date: 07/05/2021  Admitted From: Home   Disposition:  Home   Recommendations for Outpatient Follow-up:   Follow up with PCP in 1-2 weeks  PCP Please obtain BMP/CBC, 2 view CXR in 1week,  (see Discharge instructions)   PCP Please follow up on the following pending results: Monitor blood pressure closely.   Home Health: None   Equipment/Devices: None  Consultations: PCCM Discharge Condition: Stable    CODE STATUS: Full    Diet Recommendation: Heart Healthy   Diet Order             Diet - low sodium heart healthy           Diet Carb Modified Fluid consistency: Thin; Room service appropriate? Yes  Diet effective now                    Chief Complaint  Patient presents with   Weakness     Brief history of present illness from the day of admission and additional interim summary    50 yr old female with a history of UTIs, hypothyroidism, who presents with left lower abdominal pain and painful urination without fevers chills sweats or productive cough.  She is a COVID positive is an incidental finding, she was admitted for sepsis to ICU now stabilized transferred to [facility] on 07/02/2021 on day 3 of her hospital stay.                                                                 Hospital Course    Sepsis due to UTI.  History of recurrent UTIs and ESBL.  Was treated with 5 days of IV meropenem, cultures were unremarkable, this was based on previous cultures on a previous UTI.  Sepsis pathophysiology completely resolved.   2.  Hypothyroidism.  On Synthroid.  Stable TSH and cortisol.   3.  Persistent hypotension.  Sepsis pathophysiology has resolved, was adequately hydrated and  +11 L so far, asymptomatic on midodrine, gently titrating down, completely symptom-free and eager to go home will be sent home on 10 days of midodrine with outpatient blood pressure monitoring by PCP.  She had stable TSH and cortisol along with echocardiogram    Discharge diagnosis     Principal Problem:   Sepsis secondary to UTI [facility]) Active Problems:   Hypokalemia   COVID-19 virus infection   Thrombocytopenia (HCC)   Type 2 diabetes mellitus with hyperglycemia, without long-term current use of insulin (HCC)    Discharge instructions    Discharge Instructions     Diet - low sodium heart healthy   Complete by: As directed    Discharge instructions   Complete by: As directed  Follow with Primary MD  in 7 days   Get CBC, CMP, 2 view Chest X ray -  checked next visit within 1 week by Primary MD    Activity: As tolerated with Full fall precautions use walker/cane & assistance as needed  Disposition Home    Diet: Heart Healthy    Special Instructions: If you have smoked or chewed Tobacco  in the last 2 yrs please stop smoking, stop any regular Alcohol  and or any Recreational drug use.  On your next visit with your primary care physician please Get Medicines reviewed and adjusted.  Please request your Prim.MD to go over all Hospital Tests and Procedure/Radiological results at the follow up, please get all Hospital records sent to your Prim MD by signing hospital release before you go home.  If you experience worsening of your admission symptoms, develop shortness of breath, life threatening emergency, suicidal or homicidal thoughts you must seek medical attention immediately by calling 911 or calling your MD immediately  if symptoms less severe.  You Must read complete instructions/literature along with all the possible adverse reactions/side effects for all the Medicines you take and that have been prescribed to you. Take any new Medicines after you have completely understood  and accpet all the possible adverse reactions/side effects.   Increase activity slowly   Complete by: As directed    MyChart COVID-19 home monitoring program   Complete by: Jul 05, 2021    Is the patient willing to use the MyChart Mobile App for home monitoring?: Yes   Temperature monitoring   Complete by: Jul 05, 2021    After how many days would you like to receive a notification of this patient's flowsheet entries?: 1       Discharge Medications   Allergies as of 07/05/2021   No Known Allergies      Medication List     STOP taking these medications    Klor-Con M20 20 MEQ tablet Generic drug: potassium chloride SA   torsemide 20 MG tablet Commonly known as: DEMADEX       TAKE these medications    levothyroxine 50 MCG tablet Commonly known as: SYNTHROID Take 50 mcg by mouth daily.   midodrine 10 MG tablet Commonly known as: PROAMATINE Take 1 tablet (10 mg total) by mouth 2 (two) times daily with a meal.   Vitamin D (Ergocalciferol) 1.25 MG (50000 UNIT) Caps capsule Commonly known as: DRISDOL Take 50,000 Units by mouth once a week.         Follow-up Information     [facility]. Schedule an appointment as soon as possible for a visit in 1 week(s).   Contact information: [address] [place] [address] [phone]                Major procedures and Radiology Reports - PLEASE review detailed and final reports thoroughly  -       DG Chest 1 View  Result Date: 06/29/2021 CLINICAL DATA:  Fever and chills. EXAM: CHEST  1 VIEW COMPARISON:  05/29/2014. FINDINGS: The heart size and mediastinal contours are within normal limits. Both lungs are clear. No acute osseous abnormality. IMPRESSION: No acute cardiopulmonary process. Electronically Signed   By: Thornell Sartorius M.D.   On: 06/29/2021 01:48   CT ABDOMEN PELVIS W CONTRAST  Result Date: 06/29/2021 CLINICAL DATA:  Abdominal pain, acute,  nonlocalized. EXAM: CT ABDOMEN AND PELVIS WITH CONTRAST TECHNIQUE: Multidetector CT imaging of  the abdomen and pelvis was performed using the standard protocol following bolus administration of intravenous contrast. CONTRAST:  80mL OMNIPAQUE IOHEXOL 300 MG/ML  SOLN COMPARISON:  02/22/2018. FINDINGS: Lower chest: Mild dependent atelectasis at the lung bases. Hepatobiliary: No focal liver abnormality is seen. No gallstones, gallbladder wall thickening, or biliary dilatation. Pancreas: Unremarkable. No pancreatic ductal dilatation or surrounding inflammatory changes. Spleen: Normal in size without focal abnormality. Adrenals/Urinary Tract: Adrenal glands are unremarkable. Kidneys are normal, without renal calculi, focal lesion, or hydronephrosis. The kidneys enhance symmetrically. Bladder is unremarkable. Stomach/Bowel: No bowel obstruction, free air or pneumatosis. There is mild gastric wall thickening. The appendix is not clearly visualized on exam. Vascular/Lymphatic: Aortic atherosclerosis. No enlarged abdominal or pelvic lymph nodes. Reproductive: Uterus and bilateral adnexa are unremarkable. Other: Trace amount of free fluid in the right lower quadrant. Small fat containing umbilical hernia. Musculoskeletal: Mild degenerative changes in the thoracolumbar spine. No acute osseous abnormality. IMPRESSION: 1. No bowel obstruction or free air. The appendix is not definitely visualized on exam. If there is clinical concern for appendicitis, repeat evaluation with oral contrast is suggested. 2. Mild gastric wall thickening, possible gastritis. 3. Trace amount of free fluid in the right lower quadrant. Electronically Signed   By: Thornell Sartorius M.D.   On: 06/29/2021 04:47   DG Chest Port 1 View  Result Date: 07/02/2021 CLINICAL DATA:  Shortness of breath.  Coronavirus infection. EXAM: PORTABLE CHEST 1 VIEW COMPARISON:  06/29/2021 FINDINGS: Artifact overlies the chest. Heart size is normal. Mediastinal shadows are  normal. There is patchy infiltrate in the left lower lobe. The lungs otherwise appear clear. No visible effusion. No abnormal bone finding. IMPRESSION: Patchy left lower lobe pneumonia. Electronically Signed   By: Paulina Fusi M.D.   On: 07/02/2021 08:08   ECHOCARDIOGRAM COMPLETE  Result Date: 06/29/2021    ECHOCARDIOGRAM REPORT   Patient Name:   [facility]  Date of Exam: 06/29/2021 Medical Rec #:  [id number]  Height:       60.0 in Accession #:    [id number] Weight:       130.9 lb Date of Birth:  1970/10/03   BSA:          1.559 m Patient Age:    50 years   BP:           91/57 mmHg Patient Gender: F          HR:           73 bpm. Exam Location:  Inpatient Procedure: 2D Echo, Cardiac Doppler and Color Doppler Indications:    Other cardiac sounds R01.2  History:        Patient has no prior history of Echocardiogram examinations.  Sonographer:    Eulah Pont RDCS Referring Phys: [id number] Chrissie Noa S MINOR IMPRESSIONS  1. Left ventricular ejection fraction, by estimation, is 65 to 70%. The left ventricle has normal function. The left ventricle has no regional wall motion abnormalities. Left ventricular diastolic parameters were normal.  2. Right ventricular systolic function is normal. The right ventricular size is normal. There is normal pulmonary artery systolic pressure.  3. The mitral valve is normal in structure. Trivial mitral valve regurgitation. No evidence of mitral stenosis.  4. The aortic valve is grossly normal. Aortic valve regurgitation is not visualized. No aortic stenosis is present.  5. The inferior vena cava is normal in size with greater than 50% respiratory variability, suggesting right atrial pressure of 3 mmHg. Comparison(s): No prior Echocardiogram. Conclusion(s)/Recommendation(s): Normal biventricular  function without evidence of hemodynamically significant valvular heart disease. FINDINGS  Left Ventricle: Left ventricular ejection fraction, by estimation, is 65 to 70%. The left ventricle has  normal function. The left ventricle has no regional wall motion abnormalities. The left ventricular internal cavity size was normal in size. There is  no left ventricular hypertrophy. Left ventricular diastolic parameters were normal. Right Ventricle: The right ventricular size is normal. Right vetricular wall thickness was not well visualized. Right ventricular systolic function is normal. There is normal pulmonary artery systolic pressure. The tricuspid regurgitant velocity is 2.20 m/s, and with an assumed right atrial pressure of 3 mmHg, the estimated right ventricular systolic pressure is 22.4 mmHg. Left Atrium: Left atrial size was normal in size. Right Atrium: Right atrial size was normal in size. Pericardium: There is no evidence of pericardial effusion. Mitral Valve: The mitral valve is normal in structure. Trivial mitral valve regurgitation. No evidence of mitral valve stenosis. Tricuspid Valve: The tricuspid valve is normal in structure. Tricuspid valve regurgitation is mild . No evidence of tricuspid stenosis. Aortic Valve: The aortic valve is grossly normal. Aortic valve regurgitation is not visualized. No aortic stenosis is present. Pulmonic Valve: The pulmonic valve was grossly normal. Pulmonic valve regurgitation is trivial. No evidence of pulmonic stenosis. Aorta: The aortic root, ascending aorta, aortic arch and descending aorta are all structurally normal, with no evidence of dilitation or obstruction. Venous: The inferior vena cava is normal in size with greater than 50% respiratory variability, suggesting right atrial pressure of 3 mmHg. IAS/Shunts: The atrial septum is grossly normal.  LEFT VENTRICLE PLAX 2D LVIDd:         4.00 cm     Diastology LVIDs:         2.10 cm     LV e' medial:    9.02 cm/s LV PW:         0.90 cm     LV E/e' medial:  8.7 LV IVS:        0.70 cm     LV e' lateral:   10.10 cm/s LVOT diam:     1.90 cm     LV E/e' lateral: 7.8 LV SV:         45 LV SV Index:   29 LVOT Area:      2.84 cm  LV Volumes (MOD) LV vol d, MOD A2C: 66.5 ml LV vol d, MOD A4C: 50.6 ml LV vol s, MOD A2C: 17.8 ml LV vol s, MOD A4C: 16.3 ml LV SV MOD A2C:     48.7 ml LV SV MOD A4C:     50.6 ml LV SV MOD BP:      41.9 ml RIGHT VENTRICLE RV S prime:     12.40 cm/s TAPSE (M-mode): 2.1 cm LEFT ATRIUM             Index        RIGHT ATRIUM           Index LA diam:        3.10 cm 1.99 cm/m   RA Area:     13.80 cm LA Vol (A2C):   24.4 ml 15.65 ml/m  RA Volume:   33.10 ml  21.23 ml/m LA Vol (A4C):   22.9 ml 14.69 ml/m LA Biplane Vol: 24.1 ml 15.46 ml/m  AORTIC VALVE LVOT Vmax:   75.20 cm/s LVOT Vmean:  51.400 cm/s LVOT VTI:    0.160 m  AORTA Ao Root diam: 2.90 cm Ao Asc  diam:  2.70 cm MITRAL VALVE               TRICUSPID VALVE MV Area (PHT): 2.87 cm    TR Peak grad:   19.4 mmHg MV Decel Time: 264 msec    TR Vmax:        220.00 cm/s MV E velocity: 78.40 cm/s MV A velocity: 55.30 cm/s  SHUNTS MV E/A ratio:  1.42        Systemic VTI:  0.16 m                            Systemic Diam: 1.90 cm Jodelle Red MD Electronically signed by Jodelle Red MD Signature Date/Time: 06/29/2021/2:10:03 PM    Final       Today   Subjective    Heather Poole today has no headache,no chest abdominal pain,no new weakness tingling or numbness, feels much better wants to go home today.     Objective   Blood pressure (!) 97/57, pulse 70, temperature 98.2 F (36.8 C), temperature source Oral, resp. rate 18, height 5' (1.524 m), weight 63.6 kg, last menstrual period 02/02/2017, SpO2 100 %.   Intake/Output Summary (Last 24 hours) at 07/05/2021 0947 Last data filed at 07/04/2021 2026 Gross per 24 hour  Intake 914.65 ml  Output --  Net 914.65 ml    Exam  Awake Alert, No new F.N deficits, Normal affect [place].AT,PERRAL Supple Neck,No JVD, No cervical lymphadenopathy appriciated.  Symmetrical Chest wall movement, Good air movement bilaterally, CTAB RRR,No Gallops,Rubs or new Murmurs, No Parasternal Heave +ve  B.Sounds, Abd Soft, Non tender, No organomegaly appriciated, No rebound -guarding or rigidity. No Cyanosis, Clubbing or edema, No new Rash or bruise   Data Review   CBC w Diff:  Lab Results  Component Value Date   WBC 4.4 07/05/2021   HGB 10.6 (L) 07/05/2021   HCT 31.9 (L) 07/05/2021   PLT 182 07/05/2021   LYMPHOPCT 44 07/05/2021   MONOPCT 6 07/05/2021   EOSPCT 2 07/05/2021   BASOPCT 0 07/05/2021    CMP:  Lab Results  Component Value Date   NA 142 07/05/2021   K 3.3 (L) 07/05/2021   CL 107 07/05/2021   CO2 27 07/05/2021   BUN 6 07/05/2021   CREATININE 0.67 07/05/2021   PROT 5.3 (L) 07/05/2021   ALBUMIN 2.4 (L) 07/05/2021   BILITOT 0.4 07/05/2021   ALKPHOS 61 07/05/2021   AST 58 (H) 07/05/2021   ALT 49 (H) 07/05/2021  .   Total Time in preparing paper work, data evaluation and todays exam - 35 minutes  Susa Raring M.D on 07/05/2021 at 9:47 AM  [facility]

## 2021-07-05 NOTE — TOC Transition Note (Signed)
Transition of Care [facility]) - CM/SW Discharge Note   Patient Details  Name: Heather Poole MRN: [id number] Date of Birth: 1970/07/23  Transition of Care [facility]) CM/SW Contact:  Harriet Masson, RN Phone Number: 07/05/2021, 10:22 AM   Clinical Narrative:    Patient stable for discharge. Spoke to patient and she gave me her PCP information. I updated chart. Patient's husband will transport patient home. No other needs.   Final next level of care: Home/Self Care Barriers to Discharge: Barriers Resolved   Patient Goals and CMS Choice Patient states their goals for this hospitalization and ongoing recovery are:: return home      Discharge Placement             home          Discharge Plan and Services    home                                 Social Determinants of Health (SDOH) Interventions     Readmission Risk Interventions No flowsheet data found.

## 2021-07-05 NOTE — Discharge Instructions (Signed)
Follow with Primary MD  in 7 days   Get CBC, CMP, 2 view Chest X ray -  checked next visit within 1 week by Primary MD   Activity: As tolerated with Full fall precautions use walker/cane & assistance as needed  Disposition Home    Diet: Heart Healthy    Special Instructions: If you have smoked or chewed Tobacco  in the last 2 yrs please stop smoking, stop any regular Alcohol  and or any Recreational drug use.  On your next visit with your primary care physician please Get Medicines reviewed and adjusted.  Please request your Prim.MD to go over all Hospital Tests and Procedure/Radiological results at the follow up, please get all Hospital records sent to your Prim MD by signing hospital release before you go home.  If you experience worsening of your admission symptoms, develop shortness of breath, life threatening emergency, suicidal or homicidal thoughts you must seek medical attention immediately by calling 911 or calling your MD immediately  if symptoms less severe.  You Must read complete instructions/literature along with all the possible adverse reactions/side effects for all the Medicines you take and that have been prescribed to you. Take any new Medicines after you have completely understood and accpet all the possible adverse reactions/side effects.      

## 2022-02-09 IMAGING — CT CT ABD-PELV W/ CM
2 of 5 series · 16 of 46 positions shown, 18 images · IV contrast (Omni 300)
Comparison: 02/22/2018.

CLINICAL DATA: Abdominal pain, acute, nonlocalized.

EXAM:
CT ABDOMEN AND PELVIS WITH CONTRAST
TECHNIQUE: Multidetector CT imaging of the abdomen and pelvis was performed
using the standard protocol following bolus administration of
intravenous contrast.
CONTRAST:  80mL OMNIPAQUE IOHEXOL 300 MG/ML  SOLN

[Series 3: a/p w/ 5mm · axial · 0.92mm/px · z∈[+438,+788]mm · 13 of 82 slices shown, 15 images]
[im 6/82  soft-tissue]
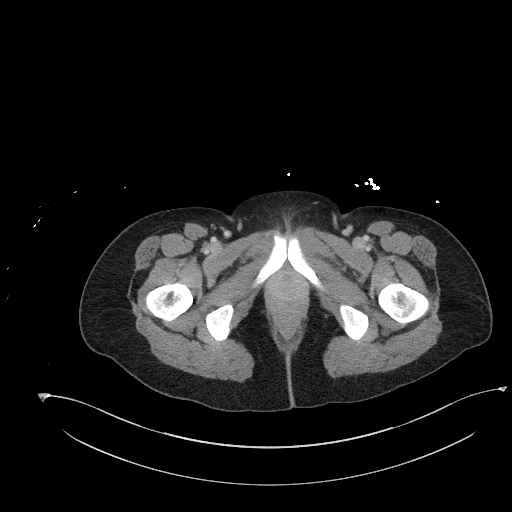
[im 6/82  bone]
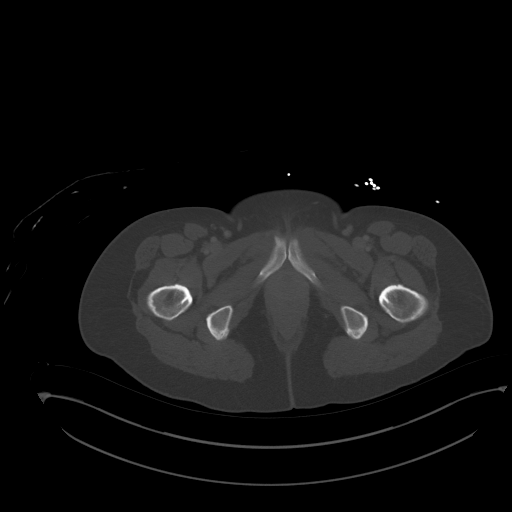
[im 12/82  soft-tissue]
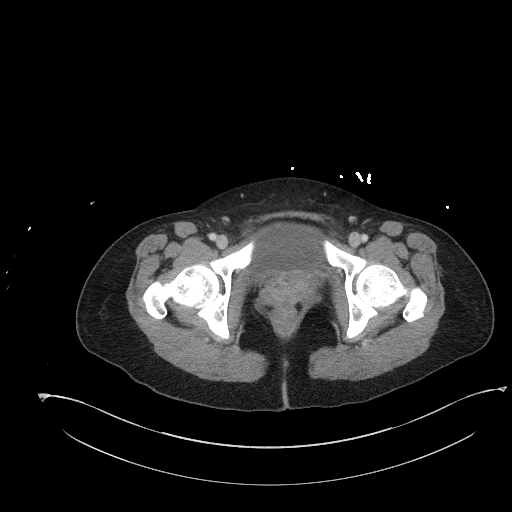
[im 18/82  soft-tissue]
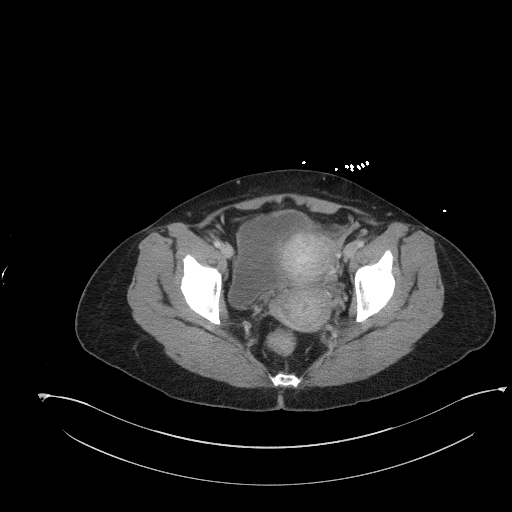
[im 24/82  soft-tissue]
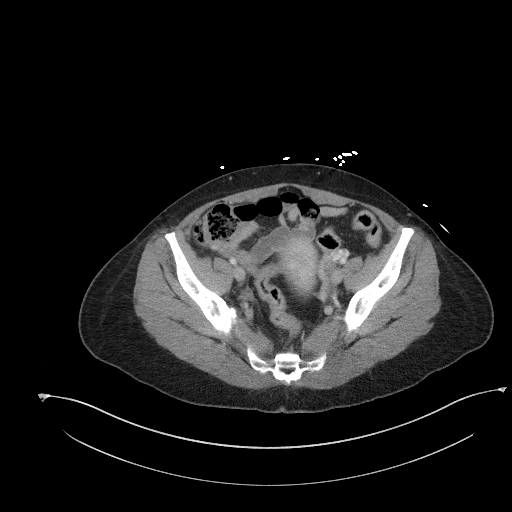
[im 29/82  soft-tissue]
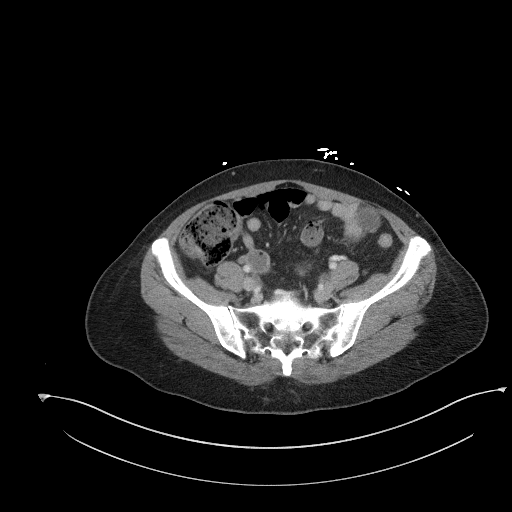
[im 35/82  soft-tissue]
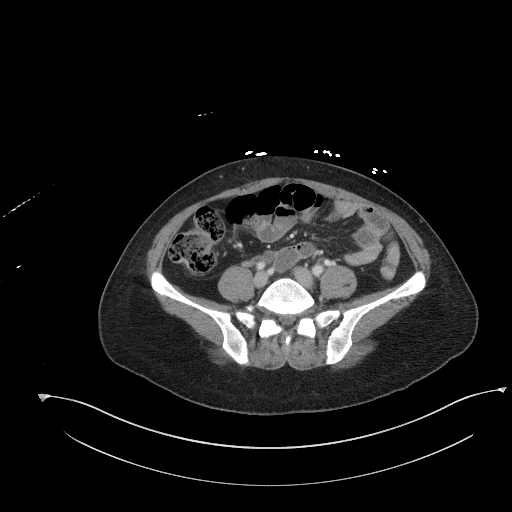
[im 41/82  soft-tissue]
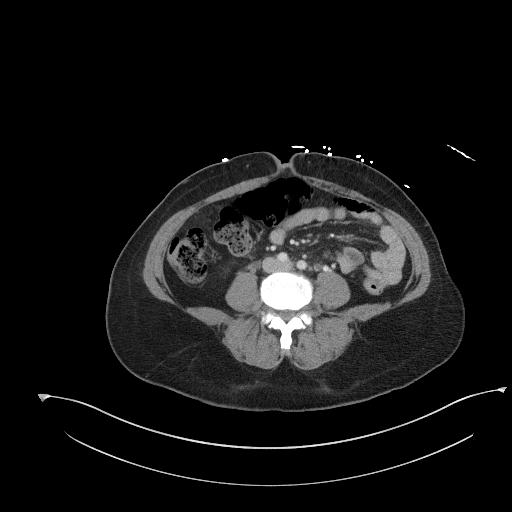
[im 47/82  soft-tissue]
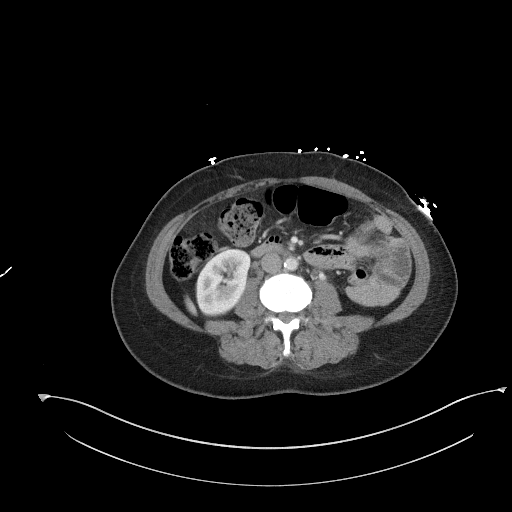
[im 53/82  soft-tissue]
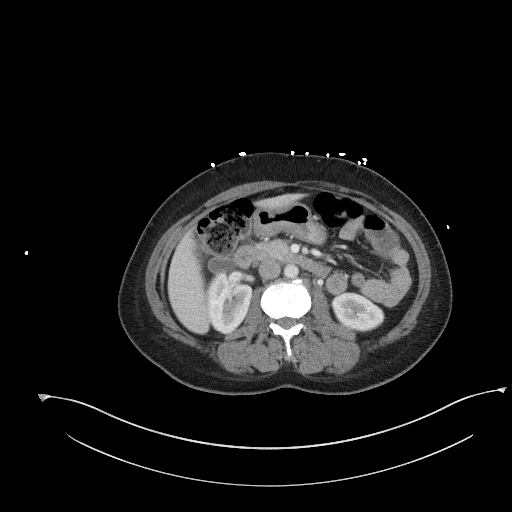
[im 53/82  bone]
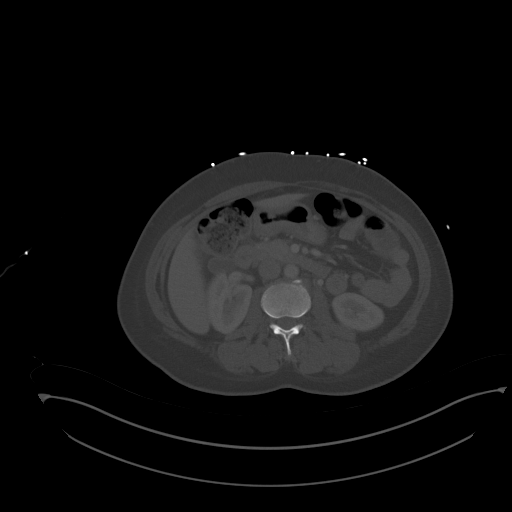
[im 58/82  soft-tissue]
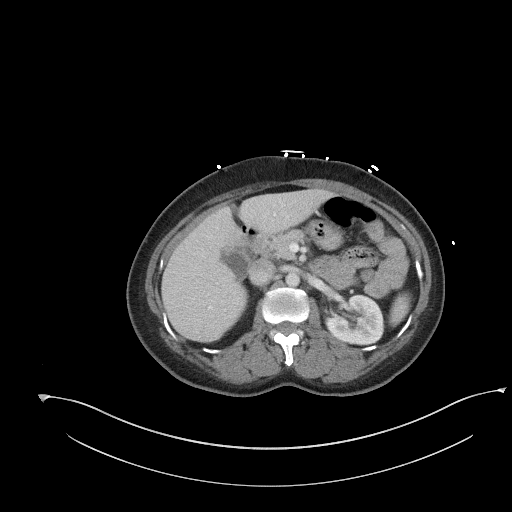
[im 64/82  soft-tissue]
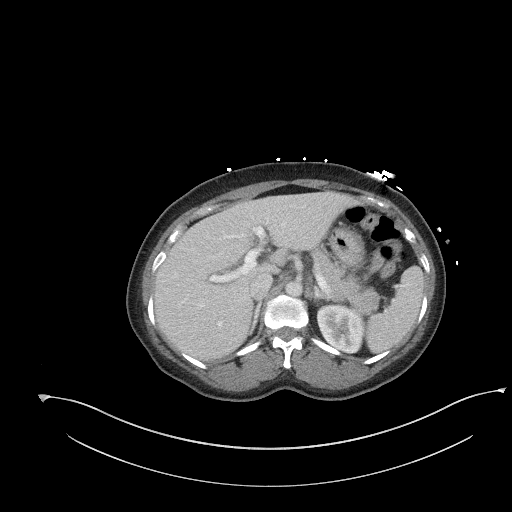
[im 70/82  soft-tissue]
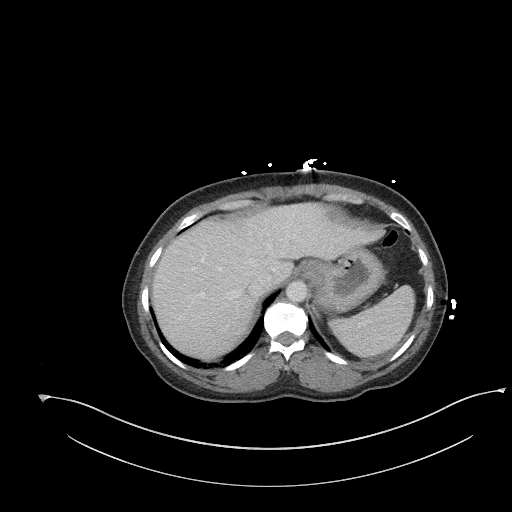
[im 76/82  soft-tissue]
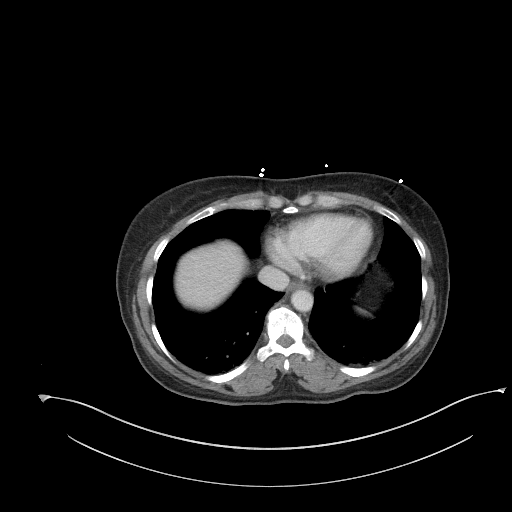

[Series 7: a/p w/ cor · coronal · 0.80mm/px · 3 of 123 slices shown]
[im 41/123  soft-tissue]
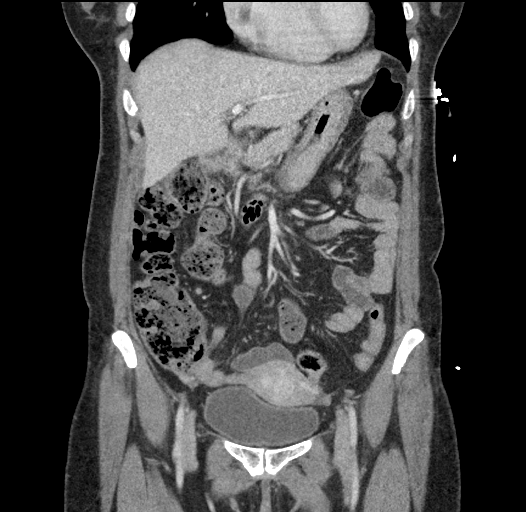
[im 55/123  soft-tissue]
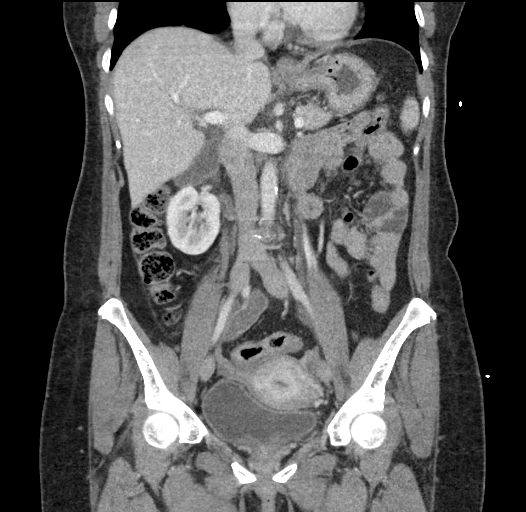
[im 68/123  soft-tissue]
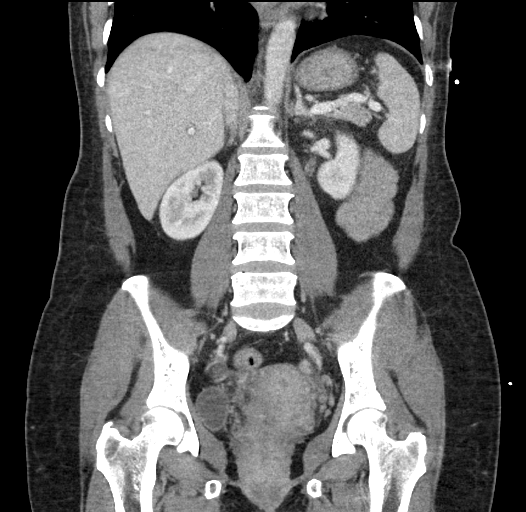

[16 of 46 positions shown; findings below may reference images not displayed]

FINDINGS: Lower chest: Mild dependent atelectasis at the lung bases.

Hepatobiliary: No focal liver abnormality is seen. No gallstones,
gallbladder wall thickening, or biliary dilatation.

Pancreas: Unremarkable. No pancreatic ductal dilatation or
surrounding inflammatory changes.

Spleen: Normal in size without focal abnormality.

Adrenals/Urinary Tract: Adrenal glands are unremarkable. Kidneys are
normal, without renal calculi, focal lesion, or hydronephrosis. The
kidneys enhance symmetrically. Bladder is unremarkable.

Stomach/Bowel: No bowel obstruction, free air or pneumatosis. There
is mild gastric wall thickening. The appendix is not clearly
visualized on exam.

Vascular/Lymphatic: Aortic atherosclerosis. No enlarged abdominal or
pelvic lymph nodes.

Reproductive: Uterus and bilateral adnexa are unremarkable.

Other: Trace amount of free fluid in the right lower quadrant. Small
fat containing umbilical hernia.

Musculoskeletal: Mild degenerative changes in the thoracolumbar
spine. No acute osseous abnormality.
IMPRESSION: 1. No bowel obstruction or free air. The appendix is not definitely
visualized on exam. If there is clinical concern for appendicitis,
repeat evaluation with oral contrast is suggested.
2. Mild gastric wall thickening, possible gastritis.
3. Trace amount of free fluid in the right lower quadrant.

## 2022-02-12 IMAGING — DX DG CHEST 1V PORT
1 series · 1 of 1 positions shown · non-contrast
Comparison: 06/29/2021

CLINICAL DATA: Shortness of breath.  Coronavirus infection.

EXAM:
PORTABLE CHEST 1 VIEW

[chest ap]
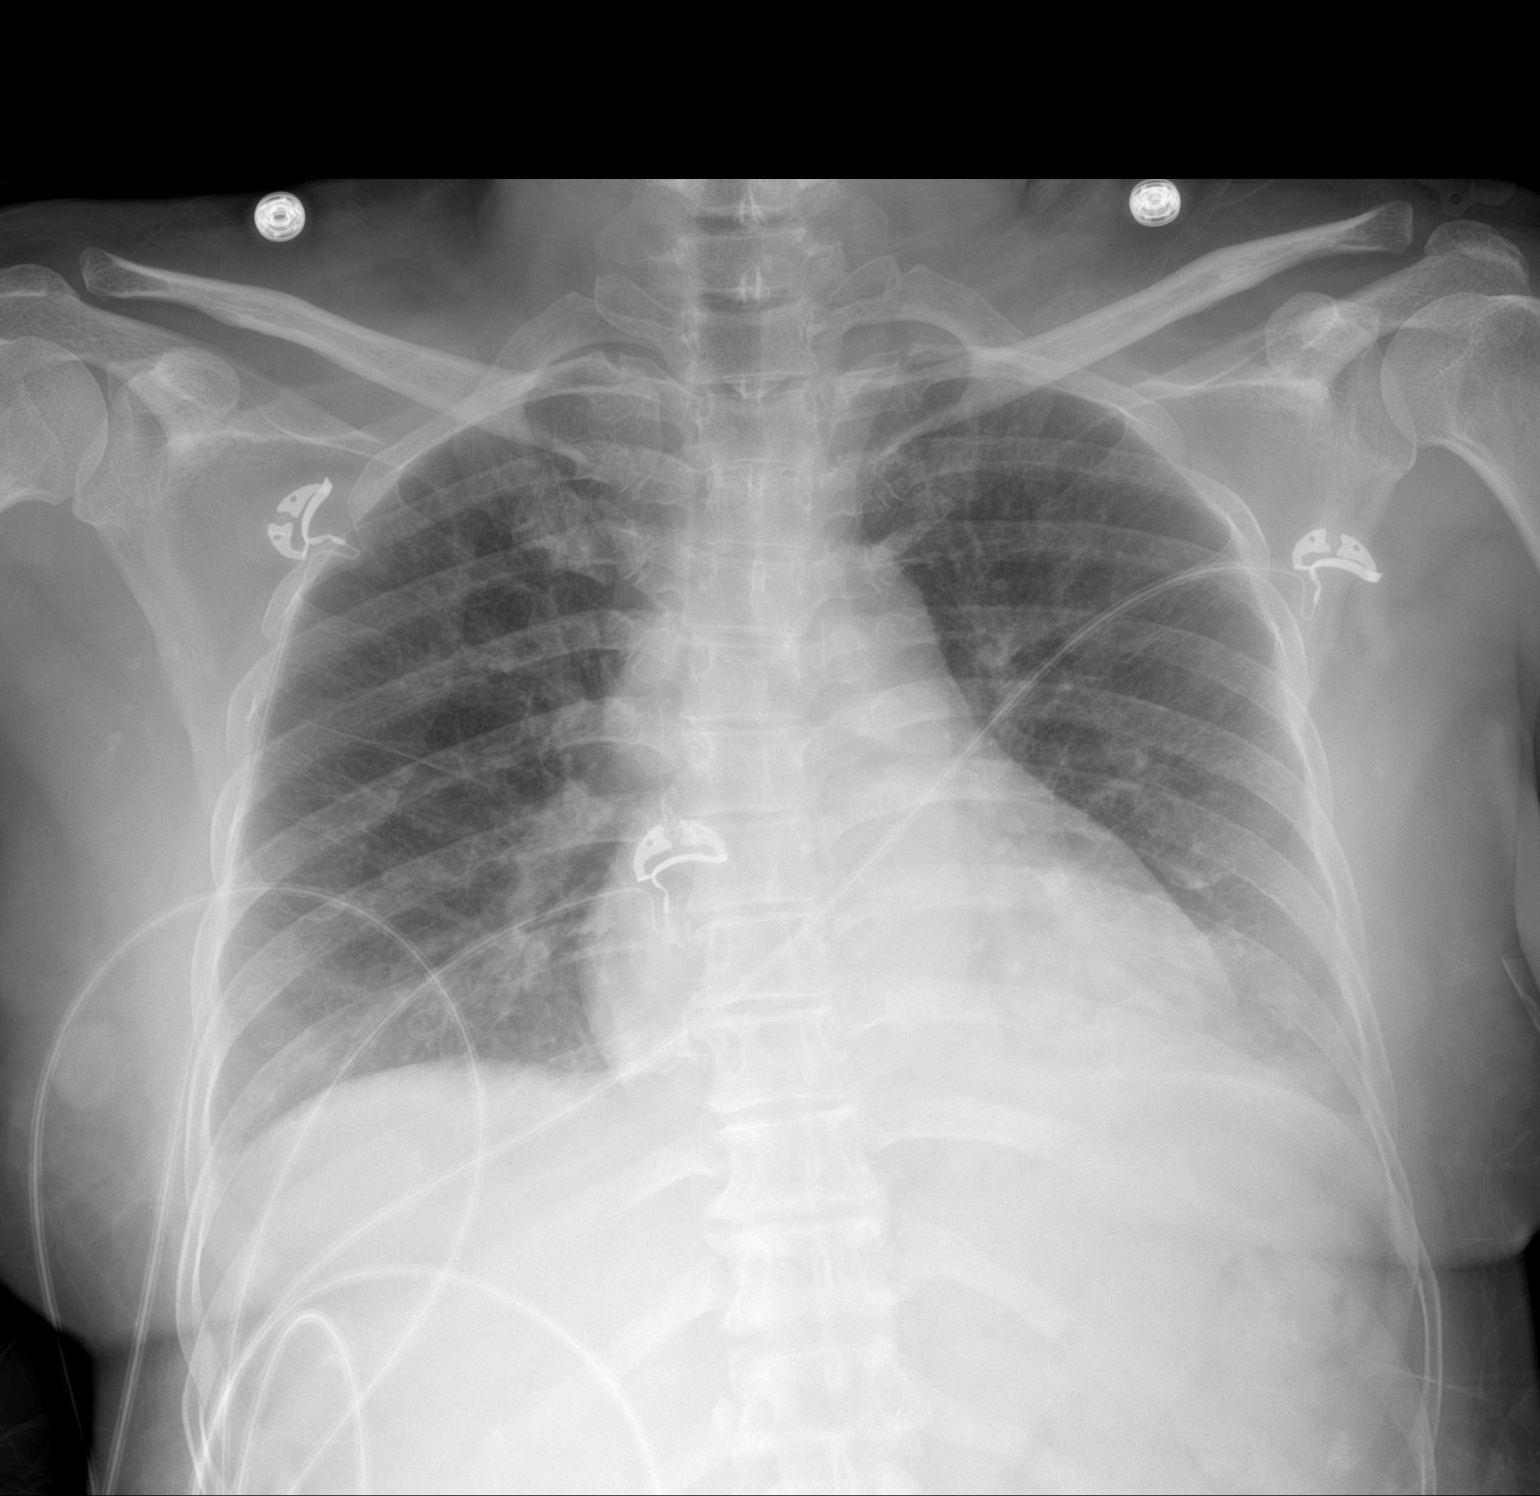

[1 of 1 positions shown; findings below may reference images not displayed]

FINDINGS: Artifact overlies the chest. Heart size is normal. Mediastinal
shadows are normal. There is patchy infiltrate in the left lower
lobe. The lungs otherwise appear clear. No visible effusion. No
abnormal bone finding.
IMPRESSION: Patchy left lower lobe pneumonia.
